# Patient Record
Sex: Female | Born: 1945 | Race: White | Hispanic: No | Marital: Married | Smoking: Never smoker
Health system: Southern US, Community
[De-identification: ages and names within clinical notes are randomized; demographics above are authoritative.]

## PROBLEM LIST (undated history)

## (undated) DIAGNOSIS — F329 Major depressive disorder, single episode, unspecified: Secondary | ICD-10-CM

## (undated) DIAGNOSIS — F32A Depression, unspecified: Secondary | ICD-10-CM

## (undated) DIAGNOSIS — I499 Cardiac arrhythmia, unspecified: Secondary | ICD-10-CM

## (undated) DIAGNOSIS — E049 Nontoxic goiter, unspecified: Secondary | ICD-10-CM

## (undated) DIAGNOSIS — I10 Essential (primary) hypertension: Secondary | ICD-10-CM

## (undated) DIAGNOSIS — I059 Rheumatic mitral valve disease, unspecified: Secondary | ICD-10-CM

## (undated) DIAGNOSIS — M810 Age-related osteoporosis without current pathological fracture: Secondary | ICD-10-CM

## (undated) DIAGNOSIS — F419 Anxiety disorder, unspecified: Secondary | ICD-10-CM

## (undated) DIAGNOSIS — R002 Palpitations: Secondary | ICD-10-CM

## (undated) HISTORY — PX: COLONOSCOPY: SHX174

## (undated) HISTORY — PX: TUBAL LIGATION: SHX77

## (undated) HISTORY — PX: OTHER SURGICAL HISTORY: SHX169

---

## 1998-12-09 ENCOUNTER — Other Ambulatory Visit: Admission: RE | Admit: 1998-12-09 | Discharge: 1998-12-09 | Payer: Self-pay | Admitting: Obstetrics and Gynecology

## 1999-04-07 ENCOUNTER — Ambulatory Visit (HOSPITAL_COMMUNITY): Admission: RE | Admit: 1999-04-07 | Discharge: 1999-04-07 | Payer: Self-pay | Admitting: Obstetrics and Gynecology

## 1999-12-13 ENCOUNTER — Other Ambulatory Visit: Admission: RE | Admit: 1999-12-13 | Discharge: 1999-12-13 | Payer: Self-pay | Admitting: Obstetrics and Gynecology

## 2005-07-27 ENCOUNTER — Ambulatory Visit: Payer: Self-pay | Admitting: Unknown Physician Specialty

## 2006-07-31 ENCOUNTER — Ambulatory Visit: Payer: Self-pay | Admitting: Unknown Physician Specialty

## 2006-08-02 ENCOUNTER — Ambulatory Visit: Payer: Self-pay | Admitting: Unknown Physician Specialty

## 2007-08-06 ENCOUNTER — Ambulatory Visit: Payer: Self-pay | Admitting: Unknown Physician Specialty

## 2007-10-21 ENCOUNTER — Ambulatory Visit: Payer: Self-pay | Admitting: Internal Medicine

## 2008-08-10 ENCOUNTER — Ambulatory Visit: Payer: Self-pay | Admitting: Unknown Physician Specialty

## 2009-08-24 ENCOUNTER — Ambulatory Visit: Payer: Self-pay | Admitting: Unknown Physician Specialty

## 2010-08-25 ENCOUNTER — Ambulatory Visit: Payer: Self-pay | Admitting: Obstetrics and Gynecology

## 2011-09-12 ENCOUNTER — Ambulatory Visit: Payer: Self-pay | Admitting: Unknown Physician Specialty

## 2012-02-20 ENCOUNTER — Ambulatory Visit: Payer: Self-pay | Admitting: Unknown Physician Specialty

## 2012-02-21 LAB — PATHOLOGY REPORT

## 2012-06-05 ENCOUNTER — Ambulatory Visit: Payer: Self-pay | Admitting: Obstetrics and Gynecology

## 2013-06-04 ENCOUNTER — Ambulatory Visit: Payer: Self-pay | Admitting: Obstetrics and Gynecology

## 2013-10-21 ENCOUNTER — Ambulatory Visit: Payer: Self-pay | Admitting: Internal Medicine

## 2013-11-07 ENCOUNTER — Inpatient Hospital Stay: Payer: Self-pay | Admitting: Internal Medicine

## 2013-11-07 LAB — URINALYSIS, COMPLETE
Bacteria: NONE SEEN
Blood: NEGATIVE
Glucose,UR: NEGATIVE mg/dL (ref 0–75)
Ketone: NEGATIVE
Nitrite: NEGATIVE
Protein: NEGATIVE
RBC,UR: <1 /HPF (ref 0–5)
Specific Gravity: 1.004 (ref 1.003–1.030)
Squamous Epithelial: NONE SEEN
WBC UR: NONE SEEN /HPF (ref 0–5)

## 2013-11-07 LAB — TROPONIN I
Troponin-I: <0.02 ng/mL
Troponin-I: <0.02 ng/mL

## 2013-11-07 LAB — CBC WITH DIFFERENTIAL/PLATELET
Eosinophil #: 0 10*3/uL (ref 0.0–0.7)
Eosinophil %: 0.1 %
HGB: 14.3 g/dL (ref 12.0–16.0)
Lymphocyte #: 0.9 10*3/uL — ABNORMAL LOW (ref 1.0–3.6)
Lymphocyte %: 7.6 %
MCH: 31.5 pg (ref 26.0–34.0)
MCHC: 34 g/dL (ref 32.0–36.0)
Monocyte %: 4.1 %
Neutrophil %: 88 %
RDW: 13.3 % (ref 11.5–14.5)
WBC: 11.6 10*3/uL — ABNORMAL HIGH (ref 3.6–11.0)

## 2013-11-07 LAB — BASIC METABOLIC PANEL
Anion Gap: 4 — ABNORMAL LOW (ref 7–16)
BUN: 5 mg/dL — ABNORMAL LOW (ref 7–18)
Calcium, Total: 10.2 mg/dL — ABNORMAL HIGH (ref 8.5–10.1)
Chloride: 102 mmol/L (ref 98–107)
Creatinine: 0.56 mg/dL — ABNORMAL LOW (ref 0.60–1.30)
EGFR (African American): >60
Glucose: 98 mg/dL (ref 65–99)
Osmolality: 271 (ref 275–301)
Sodium: 137 mmol/L (ref 136–145)

## 2013-11-07 LAB — TSH: Thyroid Stimulating Horm: 1.45 u[IU]/mL

## 2013-11-07 LAB — MAGNESIUM: Magnesium: 1.9 mg/dL

## 2013-11-07 LAB — SEDIMENTATION RATE: Erythrocyte Sed Rate: 6 mm/hr (ref 0–30)

## 2013-11-08 LAB — CBC WITH DIFFERENTIAL/PLATELET
Basophil #: 0.1 10*3/uL (ref 0.0–0.1)
HCT: 38.9 % (ref 35.0–47.0)
Lymphocyte #: 1.6 10*3/uL (ref 1.0–3.6)
MCV: 94 fL (ref 80–100)
Monocyte #: 0.6 x10 3/mm (ref 0.2–0.9)
Monocyte %: 8.1 %
Platelet: 217 10*3/uL (ref 150–440)
RBC: 4.12 10*6/uL (ref 3.80–5.20)
RDW: 13.3 % (ref 11.5–14.5)

## 2013-11-08 LAB — TROPONIN I: Troponin-I: <0.02 ng/mL

## 2013-11-08 LAB — BASIC METABOLIC PANEL
Anion Gap: 3 — ABNORMAL LOW (ref 7–16)
BUN: 7 mg/dL (ref 7–18)
Calcium, Total: 8.6 mg/dL (ref 8.5–10.1)
Chloride: 107 mmol/L (ref 98–107)
Creatinine: 0.56 mg/dL — ABNORMAL LOW (ref 0.60–1.30)
EGFR (African American): >60
Osmolality: 275 (ref 275–301)
Potassium: 3.6 mmol/L (ref 3.5–5.1)
Sodium: 139 mmol/L (ref 136–145)

## 2014-08-04 ENCOUNTER — Ambulatory Visit: Payer: Self-pay | Admitting: Obstetrics and Gynecology

## 2014-08-06 ENCOUNTER — Ambulatory Visit: Payer: Self-pay | Admitting: Obstetrics and Gynecology

## 2015-03-19 NOTE — Consult Note (Signed)
PATIENT NAME:  Charlotte Webb, Charlotte Webb MR#:  [id number] DATE OF BIRTH:  August 26, 1946  DATE OF CONSULTATION:  11/07/2013  REFERRING PHYSICIAN:  Emily Filbert, MD CONSULTING PHYSICIAN:  Isaias Cowman, MD  CHIEF COMPLAINT: Dizziness.   HISTORY OF PRESENT ILLNESS: The patient is a 69 year old female referred for evaluation of near syncope and frequent ventricular ectopy. The patient reports that she was in her usual state of health until 10/06/2013 at which time she experienced a near syncopal episode and has not felt well since. The patient underwent recent stress echocardiogram 10/29/2013 which revealed normal left ventricular function without evidence for exercise-induced ischemia.   The patient apparently had a recent Holter monitor which showed frequent multifocal PVCs and apparent short ventricular run.   She has also had a recent brain MRI which was negative.   Last evening the patient felt presyncopal and was noted to have tachycardia in rates in the 140s with elevated blood pressure. EMS was called; however, upon arrival, the patient's symptoms resolved. The patient was admitted to telemetry where she has been in normal sinus rhythm at a rate of 90 bpm. Troponin is negative. The patient apparently had a carotid ultrasound this afternoon.   PAST MEDICAL HISTORY:  1.  Frequent ventricular ectopy 2.  Presyncope.  3.  Hyperlipidemia.  4.  Varicose veins.  5.  Mitral regurgitation.  6.  Osteoporosis.   MEDICATIONS: Hydrochlorothiazide 12.5 mg daily, etodolac 400 mg p.r.n., Wellbutrin XL 150 mg daily.   SOCIAL HISTORY: The patient is married and resides with her husband. Denies tobacco or EtOH abuse.   FAMILY HISTORY: No immediate family history of coronary artery disease or myocardial infarction.   REVIEW OF SYSTEMS: CONSTITUTIONAL: No fever or chills.  EYES: No blurry vision.  EARS: No hearing loss.  RESPIRATORY: No shortness of breath.  CARDIOVASCULAR: No chest pain. The patient has  had dizziness and presyncope as described above.  GASTROINTESTINAL: No nausea, vomiting, diarrhea, or constipation.  GENITOURINARY: No dysuria or hematuria.  ENDOCRINE: No polyuria or polydipsia.  MUSCULOSKELETAL: No arthralgias or myalgias.  NEUROLOGICAL: No focal muscle weakness or numbness.  PSYCHOLOGICAL: No depression or anxiety.   PHYSICAL EXAMINATION:  VITAL SIGNS: Blood pressure 150/81, pulse 86, respirations 20, temperature 98.5, pulse ox 97%.  HEENT: Pupils equal and reactive to light and accommodation.  NECK: Supple without thyromegaly.  LUNGS: Clear.  HEART: Normal JVP. Normal PMI. Regular rate and rhythm. Normal S1, S2. No appreciable gallop, murmur, or rub.  ABDOMEN: Soft and nontender. Pulses were intact bilaterally.  MUSCULOSKELETAL: Normal muscle tone.  NEUROLOGIC: Alert and oriented x3. Motor and sensory both grossly intact.   IMPRESSION: A 69 year old female with a recent history of dizziness and presyncope found to have frequent ventricular ectopy with apparently recent normal stress echocardiogram with known normal left ventricular function.   At this time, telemetry reveals sinus rhythm with normal heart rate and blood pressure. The patient just had a carotid ultrasound performed.   RECOMMENDATIONS:  1.  Agree with overall current therapy.  2.  Would defer full-dose anticoagulation.  3.  Await carotid ultrasound results.  4.  Will discuss with Dr. Sabra Heck with regard to recent Holter monitor results. Hopefully we will be able to view the Holter monitor.  5.  Pending the patient's initial clinical course, will consider further cardiac workup which may also include cardiac catheterization to definitively rule out underlying coronary artery disease.   ____________________________ Isaias Cowman, MD ap:np D: 11/07/2013 16:32:59 ET T: 11/07/2013 17:03:29 ET JOB#: [id number]  cc: Isaias Cowman, MD, <Dictator> Isaias Cowman MD ELECTRONICALLY SIGNED  11/14/2013 8:52

## 2015-03-19 NOTE — Discharge Summary (Signed)
PATIENT NAME:  Charlotte Webb, Charlotte Webb MR#:  [id number] DATE OF BIRTH:  03-23-1946  DATE OF ADMISSION:  11/07/2013 DATE OF DISCHARGE:  11/09/2013  DISCHARGE DIAGNOSES:   1.  Nonsustained ventricular tachycardia.  2.  Thyroid nodule.   DISCHARGE MEDICATIONS:  Aspirin 81 mg daily, Wellbutrin XL 150 mg daily, Toprol-XL 25 mg 1/2 tab daily.   REASON FOR ADMISSION: A 69 year old female presents with tachyrhythmia and chest pain. Please see H and P for history of present illness, past medical history and physical exam.   HOSPITAL COURSE: The patient was admitted. Cardiac enzymes were normal. Telemetry showed normal sinus rhythm. She had recently had a stress echo showing no ischemia with normal stress cardiac enzymes and normal telemetry. No further cardiac workup was done. Toprol-XL, low-dose was added. If she were to have further problem, she would need electrophysiology testing. No signs for coronary ischemia. She had had a heart catheterization about 7 years ago which was normal and with her current symptomatology the risk of heart catheterization weighed the benefit. Overall prognosis is good.   FOLLOWUP: She will follow up with Dr. Sabra Heck in 1 week.   ____________________________ Rusty Aus, MD mfm:cs D: 11/09/2013 09:02:53 ET T: 11/09/2013 15:56:15 ET JOB#: [id number]  cc: Rusty Aus, MD, <Dictator> MARK Roselee Culver MD ELECTRONICALLY SIGNED 11/10/2013 8:08

## 2015-03-19 NOTE — H&P (Signed)
PATIENT NAME:  MCKENSI, REDINGER MR#:  [id number] DATE OF BIRTH:  [birth year]  DATE OF ADMISSION:  11/07/2013  HISTORY OF PRESENT ILLNESS:  Charlotte Webb is a 69 year old female who presents with syncope and an episode of ventricular tachycardia with chest pain. She has been feeling poorly for about a month. She has had episodes of tachyarrhythmias. Holter monitor showed short bursts of multifocal PVCs and a short run of VT. She underwent stress echo testing, which was normal. Brain MRI normal. She has been very dizzy with this. She has had blood work, which essentially was normal. However, last night, about 7 p.m., just sitting there sewing, she began having chest pain, rapid tachycardia, heart rate in the 140s, blood pressure up to 150/100, lasted an hour. EMS came out. By that time the tachycardia had resolved. Telemetry strip was normal then. She has been wiped out, tired, continues with chest pain off and on since, some epigastric pain, otherwise nonfocal. No focal neurologic deficits. With this episode and associated syncope with arrhythmia, she will be admitted for further evaluation and treatment.  PAST MEDICAL HISTORY AND MEDICAL ILLNESSES:  Osteoporosis.   PAST SURGICAL HISTORY:  1.  Thyroid nodule 2.  BTL.   ALLERGIES:  None.   MEDICATIONS:  1.  Hydrochlorothiazide 12.5 mg daily.  2.  Etodolac 400 mg b.i.d. p.r.n.  3.  Wellbutrin XL 150 mg daily.   SOCIAL HISTORY:  No smoking or alcohol.   FAMILY HISTORY:  Noncontributory.   REVIEW OF SYSTEMS:  Otherwise negative.   PHYSICAL EXAMINATION: VITAL SIGNS:  Blood pressure 144/82, pulse 108, regular. Weight 148.   NECK:  No JVD.  LUNGS:  Clear to auscultation and percussion.  HEART:  Regular rhythm, 2/6 systolic murmur, apex.  ABDOMEN:  Minimal epigastric tenderness, soft, nontender. No masses.  EXTREMITIES:  No edema.  SKIN:  No rash.  NEUROLOGIC:  Nonfocal. Bilateral cranial nerves intact.   LABORATORY DATA:  EKG shows  normal sinus rhythm.   ASSESSMENT AND PLAN:  Syncope/ventricular tachycardia - has episodic at times, very wiped out after that, still some chest pain. We will check troponins, telemetry. She can essentially barely stand up this morning post episode. We will get cardiology involved with great consideration for heart catheterization to look for ischemic disease. This could be a primary arrhythmic problem but certainly want to rule out ischemia primarily. At this point, we will give IV fluids, check electrolytes, get cardiology involved and strongly consider a heart catheterization. Ultimately, may need significant antiarrhythmic medications. There is no clear evidence for other secondary issues going on causing this problem.   ____________________________ Rusty Aus, MD mfm:jm D: 11/07/2013 11:34:00 ET T: 11/07/2013 11:53:05 ET JOB#: [id number]  cc: Rusty Aus, MD, <Dictator> Lyllie Cobbins Roselee Culver MD ELECTRONICALLY SIGNED 11/08/2013 9:11

## 2015-07-12 ENCOUNTER — Other Ambulatory Visit: Payer: Self-pay | Admitting: Internal Medicine

## 2015-07-12 DIAGNOSIS — Z1231 Encounter for screening mammogram for malignant neoplasm of breast: Secondary | ICD-10-CM

## 2015-08-09 ENCOUNTER — Ambulatory Visit
Admission: RE | Admit: 2015-08-09 | Discharge: 2015-08-09 | Disposition: A | Discharge status: Home / Self Care | Payer: Medicare HMO | Source: Ambulatory Visit | Attending: Internal Medicine | Admitting: Internal Medicine

## 2015-08-09 ENCOUNTER — Other Ambulatory Visit: Payer: Self-pay | Admitting: Internal Medicine

## 2015-08-09 DIAGNOSIS — Z1231 Encounter for screening mammogram for malignant neoplasm of breast: Secondary | ICD-10-CM | POA: Diagnosis not present

## 2016-08-28 ENCOUNTER — Other Ambulatory Visit: Payer: Self-pay | Admitting: Internal Medicine

## 2016-08-28 DIAGNOSIS — Z1231 Encounter for screening mammogram for malignant neoplasm of breast: Secondary | ICD-10-CM

## 2016-09-22 ENCOUNTER — Ambulatory Visit
Admission: RE | Admit: 2016-09-22 | Discharge: 2016-09-22 | Disposition: A | Discharge status: Home / Self Care | Payer: Medicare HMO | Source: Ambulatory Visit | Attending: Internal Medicine | Admitting: Internal Medicine

## 2016-09-22 DIAGNOSIS — Z1231 Encounter for screening mammogram for malignant neoplasm of breast: Secondary | ICD-10-CM | POA: Insufficient documentation

## 2017-08-16 ENCOUNTER — Other Ambulatory Visit: Payer: Self-pay | Admitting: Internal Medicine

## 2017-08-16 DIAGNOSIS — Z1231 Encounter for screening mammogram for malignant neoplasm of breast: Secondary | ICD-10-CM

## 2017-09-25 ENCOUNTER — Ambulatory Visit
Admission: RE | Admit: 2017-09-25 | Discharge: 2017-09-25 | Disposition: A | Discharge status: Home / Self Care | Payer: Medicare HMO | Source: Ambulatory Visit | Attending: Internal Medicine | Admitting: Internal Medicine

## 2017-09-25 DIAGNOSIS — Z1231 Encounter for screening mammogram for malignant neoplasm of breast: Secondary | ICD-10-CM | POA: Diagnosis not present

## 2017-12-04 ENCOUNTER — Other Ambulatory Visit: Payer: Self-pay | Admitting: Nurse Practitioner

## 2017-12-04 DIAGNOSIS — R1032 Left lower quadrant pain: Secondary | ICD-10-CM

## 2017-12-12 ENCOUNTER — Ambulatory Visit
Admission: RE | Admit: 2017-12-12 | Discharge: 2017-12-12 | Disposition: A | Discharge status: Home / Self Care | Payer: Medicare HMO | Source: Ambulatory Visit | Attending: Nurse Practitioner | Admitting: Nurse Practitioner

## 2017-12-12 DIAGNOSIS — R1032 Left lower quadrant pain: Secondary | ICD-10-CM | POA: Diagnosis not present

## 2017-12-12 DIAGNOSIS — I700 Atherosclerosis of aorta: Secondary | ICD-10-CM | POA: Insufficient documentation

## 2017-12-12 DIAGNOSIS — K7689 Other specified diseases of liver: Secondary | ICD-10-CM | POA: Diagnosis not present

## 2017-12-12 DIAGNOSIS — K5730 Diverticulosis of large intestine without perforation or abscess without bleeding: Secondary | ICD-10-CM | POA: Insufficient documentation

## 2017-12-12 MED ORDER — IOPAMIDOL (ISOVUE-300) INJECTION 61%
100.0000 mL | Freq: Once | INTRAVENOUS | Status: AC | PRN
  Administered 2017-12-12: 100 mL via INTRAVENOUS

## 2017-12-13 ENCOUNTER — Other Ambulatory Visit: Payer: Self-pay | Admitting: Nurse Practitioner

## 2017-12-13 DIAGNOSIS — K769 Liver disease, unspecified: Secondary | ICD-10-CM

## 2017-12-28 ENCOUNTER — Ambulatory Visit: Payer: Medicare HMO

## 2018-01-08 ENCOUNTER — Ambulatory Visit
Admission: RE | Admit: 2018-01-08 | Discharge: 2018-01-08 | Disposition: A | Discharge status: Home / Self Care | Payer: Medicare HMO | Source: Ambulatory Visit | Attending: Nurse Practitioner | Admitting: Nurse Practitioner

## 2018-01-08 DIAGNOSIS — D1803 Hemangioma of intra-abdominal structures: Secondary | ICD-10-CM | POA: Insufficient documentation

## 2018-01-08 DIAGNOSIS — K769 Liver disease, unspecified: Secondary | ICD-10-CM

## 2018-01-08 DIAGNOSIS — K7689 Other specified diseases of liver: Secondary | ICD-10-CM | POA: Diagnosis not present

## 2018-01-08 MED ORDER — GADOBENATE DIMEGLUMINE 529 MG/ML IV SOLN
15.0000 mL | Freq: Once | INTRAVENOUS | Status: AC | PRN
  Administered 2018-01-08: 15 mL via INTRAVENOUS

## 2018-01-29 ENCOUNTER — Encounter: Payer: Self-pay | Admitting: *Deleted

## 2018-01-30 ENCOUNTER — Ambulatory Visit: Payer: Medicare HMO | Admitting: Anesthesiology

## 2018-01-30 ENCOUNTER — Ambulatory Visit
Admission: RE | Admit: 2018-01-30 | Discharge: 2018-01-30 | Disposition: A | Discharge status: Home / Self Care | Payer: Medicare HMO | Source: Ambulatory Visit | Attending: Unknown Physician Specialty | Admitting: Unknown Physician Specialty

## 2018-01-30 ENCOUNTER — Encounter: Payer: Self-pay | Admitting: *Deleted

## 2018-01-30 ENCOUNTER — Encounter
Admission: RE | Disposition: A | Discharge status: Home / Self Care | Payer: Self-pay | Source: Ambulatory Visit | Attending: Unknown Physician Specialty

## 2018-01-30 DIAGNOSIS — F329 Major depressive disorder, single episode, unspecified: Secondary | ICD-10-CM | POA: Insufficient documentation

## 2018-01-30 DIAGNOSIS — F419 Anxiety disorder, unspecified: Secondary | ICD-10-CM | POA: Insufficient documentation

## 2018-01-30 DIAGNOSIS — Z8371 Family history of colonic polyps: Secondary | ICD-10-CM | POA: Diagnosis not present

## 2018-01-30 DIAGNOSIS — Z7982 Long term (current) use of aspirin: Secondary | ICD-10-CM | POA: Insufficient documentation

## 2018-01-30 DIAGNOSIS — Z1211 Encounter for screening for malignant neoplasm of colon: Secondary | ICD-10-CM | POA: Insufficient documentation

## 2018-01-30 DIAGNOSIS — K5730 Diverticulosis of large intestine without perforation or abscess without bleeding: Secondary | ICD-10-CM | POA: Diagnosis not present

## 2018-01-30 DIAGNOSIS — Z79899 Other long term (current) drug therapy: Secondary | ICD-10-CM | POA: Diagnosis not present

## 2018-01-30 DIAGNOSIS — K640 First degree hemorrhoids: Secondary | ICD-10-CM | POA: Diagnosis not present

## 2018-01-30 HISTORY — DX: Cardiac arrhythmia, unspecified: I49.9

## 2018-01-30 HISTORY — PX: COLONOSCOPY WITH PROPOFOL: SHX5780

## 2018-01-30 HISTORY — DX: Major depressive disorder, single episode, unspecified: F32.9

## 2018-01-30 HISTORY — DX: Age-related osteoporosis without current pathological fracture: M81.0

## 2018-01-30 HISTORY — DX: Anxiety disorder, unspecified: F41.9

## 2018-01-30 HISTORY — DX: Depression, unspecified: F32.A

## 2018-01-30 HISTORY — DX: Nontoxic goiter, unspecified: E04.9

## 2018-01-30 HISTORY — DX: Rheumatic mitral valve disease, unspecified: I05.9

## 2018-01-30 SURGERY — COLONOSCOPY WITH PROPOFOL
Anesthesia: General

## 2018-01-30 MED ORDER — PROPOFOL 10 MG/ML IV BOLUS
INTRAVENOUS | Status: DC | PRN
  Administered 2018-01-30: 100 mg via INTRAVENOUS

## 2018-01-30 MED ORDER — LIDOCAINE 2% (20 MG/ML) 5 ML SYRINGE
INTRAMUSCULAR | Status: DC | PRN
  Administered 2018-01-30: 30 mg via INTRAVENOUS

## 2018-01-30 MED ORDER — FENTANYL CITRATE (PF) 100 MCG/2ML IJ SOLN
INTRAMUSCULAR | Status: DC | PRN
  Administered 2018-01-30: 50 ug via INTRAVENOUS

## 2018-01-30 MED ORDER — PROPOFOL 10 MG/ML IV BOLUS
INTRAVENOUS | Status: AC
  Filled 2018-01-30: qty 20

## 2018-01-30 MED ORDER — PHENYLEPHRINE HCL 10 MG/ML IJ SOLN
INTRAMUSCULAR | Status: DC | PRN
  Administered 2018-01-30: 100 ug via INTRAVENOUS

## 2018-01-30 MED ORDER — LIDOCAINE HCL (PF) 2 % IJ SOLN
INTRAMUSCULAR | Status: AC
  Filled 2018-01-30: qty 10

## 2018-01-30 MED ORDER — SODIUM CHLORIDE 0.9 % IV SOLN
INTRAVENOUS | Status: DC
  Administered 2018-01-30 (×2): via INTRAVENOUS

## 2018-01-30 MED ORDER — SODIUM CHLORIDE 0.9 % IV SOLN: INTRAVENOUS | Status: DC

## 2018-01-30 MED ORDER — PROPOFOL 500 MG/50ML IV EMUL
INTRAVENOUS | Status: DC | PRN
  Administered 2018-01-30: 140 ug/kg/min via INTRAVENOUS

## 2018-01-30 MED ORDER — PROPOFOL 500 MG/50ML IV EMUL
INTRAVENOUS | Status: AC
  Filled 2018-01-30: qty 50

## 2018-01-30 MED ORDER — FENTANYL CITRATE (PF) 100 MCG/2ML IJ SOLN
INTRAMUSCULAR | Status: AC
  Filled 2018-01-30: qty 2

## 2018-01-30 NOTE — Anesthesia Preprocedure Evaluation (Signed)
Anesthesia Evaluation  Patient identified by MRN, date of birth, ID band Patient awake    Reviewed: Allergy & Precautions, H&P , NPO status , Patient's Chart, lab work & pertinent test results  History of Anesthesia Complications Negative for: history of anesthetic complications  Airway Mallampati: III  TM Distance: >3 FB Neck ROM: limited    Dental  (+) Chipped   Pulmonary neg pulmonary ROS, neg shortness of breath,           Cardiovascular Exercise Tolerance: Good (-) angina(-) Past MI and (-) DOE negative cardio ROS       Neuro/Psych PSYCHIATRIC DISORDERS Anxiety Depression negative neurological ROS     GI/Hepatic negative GI ROS, Neg liver ROS, neg GERD  ,  Endo/Other  negative endocrine ROS  Renal/GU negative Renal ROS  negative genitourinary   Musculoskeletal   Abdominal   Peds  Hematology negative hematology ROS (+)   Anesthesia Other Findings Past Medical History: No date: Anxiety No date: Arrhythmia No date: Depression No date: Goiter No date: Mitral valve disorder No date: Osteoporosis  Past Surgical History: No date: COLONOSCOPY No date: thyroid nodule removed 20 years ago No date: TUBAL LIGATION     Reproductive/Obstetrics negative OB ROS                             Anesthesia Physical Anesthesia Plan  ASA: III  Anesthesia Plan: General   Post-op Pain Management:    Induction: Intravenous  PONV Risk Score and Plan: Propofol infusion  Airway Management Planned: Natural Airway and Nasal Cannula  Additional Equipment:   Intra-op Plan:   Post-operative Plan:   Informed Consent: I have reviewed the patients History and Physical, chart, labs and discussed the procedure including the risks, benefits and alternatives for the proposed anesthesia with the patient or authorized representative who has indicated his/her understanding and acceptance.   Dental  Advisory Given  Plan Discussed with: Anesthesiologist, CRNA and Surgeon  Anesthesia Plan Comments: (Patient consented for risks of anesthesia including but not limited to:  - adverse reactions to medications - risk of intubation if required - damage to teeth, lips or other oral mucosa - sore throat or hoarseness - Damage to heart, brain, lungs or loss of life  Patient voiced understanding.)        Anesthesia Quick Evaluation

## 2018-01-30 NOTE — Anesthesia Post-op Follow-up Note (Signed)
Anesthesia QCDR form completed.        

## 2018-01-30 NOTE — Transfer of Care (Signed)
Immediate Anesthesia Transfer of Care Note  Patient: Charlotte Webb  Procedure(s) Performed: COLONOSCOPY WITH PROPOFOL (N/A )  Patient Location: PACU and Endoscopy Unit  Anesthesia Type:General  Level of Consciousness: sedated  Airway & Oxygen Therapy: Patient Spontanous Breathing and Patient connected to nasal cannula oxygen  Post-op Assessment: Report given to RN and Post -op Vital signs reviewed and stable  Post vital signs: Reviewed and stable  Last Vitals:  Vitals:   01/30/18 0656  BP: (!) 150/73  Pulse: 71  Resp: 16  Temp: (!) 36.3 C  SpO2: 99%    Last Pain:  Vitals:   01/30/18 0656  TempSrc: Tympanic         Complications: No apparent anesthesia complications

## 2018-01-30 NOTE — H&P (Signed)
   Primary Care Physician:  Rusty Aus, MD Primary Gastroenterologist:  Dr. Vira Agar  Pre-Procedure History & Physical: HPI:  Charlotte Webb is a 72 y.o. female is here for an screening colonoscopy.  A sister has had colon polyps   Past Medical History:  Diagnosis Date  . Anxiety   . Arrhythmia   . Depression   . Goiter   . Mitral valve disorder   . Osteoporosis     Past Surgical History:  Procedure Laterality Date  . COLONOSCOPY    . thyroid nodule removed 20 years ago    . TUBAL LIGATION      Prior to Admission medications   Medication Sig Start Date End Date Taking? Authorizing Provider  acetaminophen (TYLENOL) 500 MG tablet Take 500 mg by mouth every 4 (four) hours as needed.   Yes [provider]  aspirin EC 81 MG tablet Take 81 mg by mouth daily.   Yes [provider]  Calcium Carb-Cholecalciferol (CALCIUM CARBONATE-VITAMIN D3 PO) Take 500 mg by mouth.   Yes [provider]  escitalopram (LEXAPRO) 10 MG tablet Take 10 mg by mouth daily.   Yes [provider]  metoprolol succinate (TOPROL-XL) 25 MG 24 hr tablet Take 12.5 mg by mouth 2 (two) times daily.   Yes [provider]    Allergies as of 12/10/2017  . (Not on File)    Family History  Problem Relation Age of Onset  . Brain cancer Mother   . Lung cancer Maternal Uncle   . Ovarian cancer Maternal Grandmother   . Breast cancer Neg Hx     Social History   Socioeconomic History  . Marital status: Married    Spouse name: Not on file  . Number of children: Not on file  . Years of education: Not on file  . Highest education level: Not on file  Social Needs  . Financial resource strain: Not on file  . Food insecurity - worry: Not on file  . Food insecurity - inability: Not on file  . Transportation needs - medical: Not on file  . Transportation needs - non-medical: Not on file  Occupational History  . Not on file  Tobacco Use  . Smoking status: Never  Smoker  . Smokeless tobacco: Never Used  Substance and Sexual Activity  . Alcohol use: No    Frequency: Never  . Drug use: No  . Sexual activity: Not on file  Other Topics Concern  . Not on file  Social History Narrative  . Not on file    Review of Systems: See HPI, otherwise negative ROS  Physical Exam: BP (!) 150/73   Pulse 71   Temp (!) 97.4 F (36.3 C) (Tympanic)   Resp 16   SpO2 99%  General:   Alert,  pleasant and cooperative in NAD Head:  Normocephalic and atraumatic. Neck:  Supple; no masses or thyromegaly. Lungs:  Clear throughout to auscultation.    Heart:  Regular rate and rhythm. Abdomen:  Soft, nontender and nondistended. Normal bowel sounds, without guarding, and without rebound.   Neurologic:  Alert and  oriented x4;  grossly normal neurologically.  Impression/Plan: Charlotte Webb is here for an colonoscopy to be performed for screening for family history of colon polyps.  Risks, benefits, limitations, and alternatives regarding  colonoscopy have been reviewed with the patient.  Questions have been answered.  All parties agreeable.   Gaylyn Cheers, MD  01/30/2018, 7:29 AM

## 2018-01-30 NOTE — Op Note (Signed)
[facility] Gastroenterology Patient Name: Charlotte Webb Procedure Date: 01/30/2018 7:25 AM MRN: [id number] Account #: [id number] Date of Birth: July 26, 1946 Admit Type: Outpatient Age: 72 Room: [facility] ENDO ROOM 3 Gender: Female Note Status: Finalized Procedure:            Colonoscopy Indications:          Colon cancer screening in patient at increased risk:                        Family history of 1st-degree relative with colon polyps Providers:            Manya Silvas, MD Referring MD:         Rusty Aus, MD (Referring MD) Medicines:            Propofol per Anesthesia Complications:        No immediate complications. Procedure:            Pre-Anesthesia Assessment:                       - After reviewing the risks and benefits, the patient                        was deemed in satisfactory condition to undergo the                        procedure.                       After obtaining informed consent, the colonoscope was                        passed under direct vision. Throughout the procedure,                        the patient's blood pressure, pulse, and oxygen                        saturations were monitored continuously. The                        Colonoscope was introduced through the anus and                        advanced to the the cecum, identified by appendiceal                        orifice and ileocecal valve. The colonoscopy was                        performed without difficulty. The patient tolerated the                        procedure well. The quality of the bowel preparation                        was excellent. Findings:      Multiple small-mouthed diverticula were found in the sigmoid colon.      Internal hemorrhoids were found during endoscopy. The hemorrhoids were       small and Grade I (internal hemorrhoids that do not prolapse).  The exam was otherwise without abnormality. Impression:           - Diverticulosis in the  sigmoid colon.                       - Internal hemorrhoids.                       - The examination was otherwise normal.                       - No specimens collected. Recommendation:       - Repeat colonoscopy in 5 years for screening purposes. Manya Silvas, MD 01/30/2018 7:53:38 AM This report has been signed electronically. Number of Addenda: 0 Note Initiated On: 01/30/2018 7:25 AM Scope Withdrawal Time: 0 hours 8 minutes 17 seconds  Total Procedure Duration: 0 hours 14 minutes 17 seconds       [facility]

## 2018-01-30 NOTE — Anesthesia Postprocedure Evaluation (Signed)
Anesthesia Post Note  Patient: Charlotte Webb  Procedure(s) Performed: COLONOSCOPY WITH PROPOFOL (N/A )  Patient location during evaluation: Endoscopy Anesthesia Type: General Level of consciousness: awake and alert Pain management: pain level controlled Vital Signs Assessment: post-procedure vital signs reviewed and stable Respiratory status: spontaneous breathing, nonlabored ventilation, respiratory function stable and patient connected to nasal cannula oxygen Cardiovascular status: blood pressure returned to baseline and stable Postop Assessment: no apparent nausea or vomiting Anesthetic complications: no     Last Vitals:  Vitals:   01/30/18 0757 01/30/18 0759  BP: (!) 93/47 (!) 95/50  Pulse: 66   Resp: 13   Temp: (!) 36.3 C   SpO2: 100%     Last Pain:  Vitals:   01/30/18 0755  TempSrc: Tympanic                 Precious Haws Vidal Lampkins

## 2018-01-31 ENCOUNTER — Encounter: Payer: Self-pay | Admitting: Unknown Physician Specialty

## 2018-08-15 ENCOUNTER — Other Ambulatory Visit: Payer: Self-pay | Admitting: Internal Medicine

## 2018-08-15 DIAGNOSIS — Z1231 Encounter for screening mammogram for malignant neoplasm of breast: Secondary | ICD-10-CM

## 2018-09-26 ENCOUNTER — Ambulatory Visit
Admission: RE | Admit: 2018-09-26 | Discharge: 2018-09-26 | Disposition: A | Discharge status: Home / Self Care | Payer: Medicare HMO | Source: Ambulatory Visit | Attending: Internal Medicine | Admitting: Internal Medicine

## 2018-09-26 DIAGNOSIS — Z1231 Encounter for screening mammogram for malignant neoplasm of breast: Secondary | ICD-10-CM | POA: Insufficient documentation

## 2018-10-03 ENCOUNTER — Emergency Department: Payer: Medicare HMO

## 2018-10-03 ENCOUNTER — Inpatient Hospital Stay
Admission: EM | Admit: 2018-10-03 | Discharge: 2018-10-06 | DRG: 482 | Disposition: A | Discharge status: Home Health | Payer: Medicare HMO | Attending: Orthopedic Surgery | Admitting: Orthopedic Surgery

## 2018-10-03 ENCOUNTER — Encounter: Payer: Self-pay | Admitting: Emergency Medicine

## 2018-10-03 ENCOUNTER — Other Ambulatory Visit: Payer: Self-pay

## 2018-10-03 DIAGNOSIS — M810 Age-related osteoporosis without current pathological fracture: Secondary | ICD-10-CM | POA: Diagnosis present

## 2018-10-03 DIAGNOSIS — S72009A Fracture of unspecified part of neck of unspecified femur, initial encounter for closed fracture: Secondary | ICD-10-CM | POA: Diagnosis present

## 2018-10-03 DIAGNOSIS — W1839XA Other fall on same level, initial encounter: Secondary | ICD-10-CM | POA: Diagnosis present

## 2018-10-03 DIAGNOSIS — S72012A Unspecified intracapsular fracture of left femur, initial encounter for closed fracture: Secondary | ICD-10-CM | POA: Diagnosis present

## 2018-10-03 DIAGNOSIS — F329 Major depressive disorder, single episode, unspecified: Secondary | ICD-10-CM | POA: Diagnosis present

## 2018-10-03 DIAGNOSIS — E876 Hypokalemia: Secondary | ICD-10-CM | POA: Diagnosis present

## 2018-10-03 DIAGNOSIS — Y93K1 Activity, walking an animal: Secondary | ICD-10-CM | POA: Diagnosis not present

## 2018-10-03 DIAGNOSIS — M25552 Pain in left hip: Secondary | ICD-10-CM | POA: Diagnosis present

## 2018-10-03 DIAGNOSIS — Z79899 Other long term (current) drug therapy: Secondary | ICD-10-CM

## 2018-10-03 DIAGNOSIS — I10 Essential (primary) hypertension: Secondary | ICD-10-CM | POA: Diagnosis present

## 2018-10-03 DIAGNOSIS — S72002A Fracture of unspecified part of neck of left femur, initial encounter for closed fracture: Secondary | ICD-10-CM

## 2018-10-03 DIAGNOSIS — I059 Rheumatic mitral valve disease, unspecified: Secondary | ICD-10-CM | POA: Diagnosis present

## 2018-10-03 DIAGNOSIS — F419 Anxiety disorder, unspecified: Secondary | ICD-10-CM | POA: Diagnosis present

## 2018-10-03 DIAGNOSIS — Z7982 Long term (current) use of aspirin: Secondary | ICD-10-CM | POA: Diagnosis not present

## 2018-10-03 DIAGNOSIS — T148XXA Other injury of unspecified body region, initial encounter: Secondary | ICD-10-CM

## 2018-10-03 HISTORY — DX: Essential (primary) hypertension: I10

## 2018-10-03 LAB — MRSA PCR SCREENING: MRSA by PCR: NEGATIVE

## 2018-10-03 LAB — COMPREHENSIVE METABOLIC PANEL
ALK PHOS: 71 U/L (ref 38–126)
ALT: 15 U/L (ref 0–44)
AST: 24 U/L (ref 15–41)
Albumin: 4.3 g/dL (ref 3.5–5.0)
Anion gap: 8 (ref 5–15)
BILIRUBIN TOTAL: 0.7 mg/dL (ref 0.3–1.2)
BUN: 14 mg/dL (ref 8–23)
CALCIUM: 9.6 mg/dL (ref 8.9–10.3)
CHLORIDE: 104 mmol/L (ref 98–111)
CO2: 28 mmol/L (ref 22–32)
CREATININE: 0.58 mg/dL (ref 0.44–1.00)
GFR calc Af Amer: >60 mL/min (ref >60)
Glucose, Bld: 125 mg/dL — ABNORMAL HIGH (ref 70–99)
Potassium: 3.4 mmol/L — ABNORMAL LOW (ref 3.5–5.1)
Sodium: 140 mmol/L (ref 135–145)
Total Protein: 7.5 g/dL (ref 6.5–8.1)

## 2018-10-03 LAB — CBC WITH DIFFERENTIAL/PLATELET
ABS IMMATURE GRANULOCYTES: 0.06 10*3/uL (ref 0.00–0.07)
Basophils Absolute: 0 10*3/uL (ref 0.0–0.1)
Basophils Relative: 0 %
Eosinophils Absolute: 0.1 10*3/uL (ref 0.0–0.5)
Eosinophils Relative: 1 %
HEMATOCRIT: 40.9 % (ref 36.0–46.0)
HEMOGLOBIN: 13.3 g/dL (ref 12.0–15.0)
IMMATURE GRANULOCYTES: 1 %
LYMPHS ABS: 1.3 10*3/uL (ref 0.7–4.0)
Lymphocytes Relative: 11 %
MCH: 30.5 pg (ref 26.0–34.0)
MCHC: 32.5 g/dL (ref 30.0–36.0)
MCV: 93.8 fL (ref 80.0–100.0)
MONO ABS: 0.7 10*3/uL (ref 0.1–1.0)
Monocytes Relative: 6 %
NEUTROS ABS: 9.5 10*3/uL — AB (ref 1.7–7.7)
NEUTROS PCT: 81 %
PLATELETS: 245 10*3/uL (ref 150–400)
RBC: 4.36 MIL/uL (ref 3.87–5.11)
RDW: 12.7 % (ref 11.5–15.5)
WBC: 11.6 10*3/uL — ABNORMAL HIGH (ref 4.0–10.5)
nRBC: 0 % (ref 0.0–0.2)

## 2018-10-03 MED ORDER — ESCITALOPRAM OXALATE 10 MG PO TABS
10.0000 mg | ORAL_TABLET | Freq: Every day | ORAL | Status: DC
  Administered 2018-10-05 – 2018-10-06 (×2): 10 mg via ORAL
  Filled 2018-10-03 (×4): qty 1

## 2018-10-03 MED ORDER — CEFAZOLIN SODIUM-DEXTROSE 1-4 GM/50ML-% IV SOLN
1.0000 g | INTRAVENOUS | Status: DC
  Filled 2018-10-03: qty 50

## 2018-10-03 MED ORDER — CEFAZOLIN (ANCEF) 1 G IV SOLR: 1.0000 g | INTRAVENOUS | Status: DC

## 2018-10-03 MED ORDER — ACETAMINOPHEN 650 MG RE SUPP: 650.0000 mg | Freq: Four times a day (QID) | RECTAL | Status: DC | PRN

## 2018-10-03 MED ORDER — METOPROLOL SUCCINATE ER 25 MG PO TB24
12.5000 mg | ORAL_TABLET | Freq: Two times a day (BID) | ORAL | Status: DC
  Administered 2018-10-03 – 2018-10-06 (×6): 12.5 mg via ORAL
  Filled 2018-10-03 (×7): qty 1

## 2018-10-03 MED ORDER — HYDROCODONE-ACETAMINOPHEN 5-325 MG PO TABS
1.0000 | ORAL_TABLET | ORAL | Status: DC | PRN
  Administered 2018-10-03: 2 via ORAL
  Administered 2018-10-04: 1 via ORAL
  Administered 2018-10-04: 2 via ORAL
  Filled 2018-10-03 (×2): qty 1
  Filled 2018-10-03: qty 2
  Filled 2018-10-03: qty 1
  Filled 2018-10-03: qty 2

## 2018-10-03 MED ORDER — ACETAMINOPHEN 500 MG PO TABS
1000.0000 mg | ORAL_TABLET | Freq: Once | ORAL | Status: AC
  Administered 2018-10-03: 1000 mg via ORAL
  Filled 2018-10-03: qty 2

## 2018-10-03 MED ORDER — SODIUM CHLORIDE 0.9 % IV SOLN
INTRAVENOUS | Status: DC
  Administered 2018-10-03 – 2018-10-04 (×3): via INTRAVENOUS

## 2018-10-03 MED ORDER — ONDANSETRON HCL 4 MG PO TABS: 4.0000 mg | ORAL_TABLET | Freq: Four times a day (QID) | ORAL | Status: DC | PRN

## 2018-10-03 MED ORDER — OXYCODONE HCL 5 MG PO TABS
10.0000 mg | ORAL_TABLET | Freq: Once | ORAL | Status: AC
  Administered 2018-10-03: 10 mg via ORAL
  Filled 2018-10-03: qty 2

## 2018-10-03 MED ORDER — ONDANSETRON HCL 4 MG/2ML IJ SOLN
4.0000 mg | Freq: Four times a day (QID) | INTRAMUSCULAR | Status: DC | PRN
  Administered 2018-10-04: 4 mg via INTRAVENOUS
  Filled 2018-10-03: qty 2

## 2018-10-03 MED ORDER — ACETAMINOPHEN 325 MG PO TABS: 650.0000 mg | ORAL_TABLET | Freq: Four times a day (QID) | ORAL | Status: DC | PRN

## 2018-10-03 MED ORDER — INFLUENZA VAC SPLIT HIGH-DOSE 0.5 ML IM SUSY
0.5000 mL | PREFILLED_SYRINGE | INTRAMUSCULAR | Status: DC
  Filled 2018-10-03: qty 0.5

## 2018-10-03 NOTE — H&P (Signed)
[facility] NAME: Charlotte Webb    MR#:  [id number]  DATE OF BIRTH:  [birth year]  DATE OF ADMISSION:  10/03/2018  PRIMARY CARE PHYSICIAN: Rusty Aus, MD   REQUESTING/REFERRING PHYSICIAN: Lisa Roca MD  CHIEF COMPLAINT:   Chief Complaint  Patient presents with  . Fall    HISTORY OF PRESENT ILLNESS: Charlotte Webb  is a 72 y.o. female with a known history of anxiety, depression, hypertension and osteoporosis who is presenting after a fall.  Patient states that she was walking her dog when the dog started running after a cat.  Subsequently patient started following the dog and fell.  And sustained a hip fracture.  Patient otherwise states that she has been in good health.  Has not had any chest pain shortness of breath no palpitations or syncope.  Currently has some pain in the left hip  PAST MEDICAL HISTORY:   Past Medical History:  Diagnosis Date  . Anxiety   . Arrhythmia   . Depression   . Goiter   . Hypertension   . Mitral valve disorder   . Osteoporosis     PAST SURGICAL HISTORY:  Past Surgical History:  Procedure Laterality Date  . COLONOSCOPY    . COLONOSCOPY WITH PROPOFOL N/A 01/30/2018   Procedure: COLONOSCOPY WITH PROPOFOL;  Surgeon: Manya Silvas, MD;  Location: [facility] ENDOSCOPY;  Service: Endoscopy;  Laterality: N/A;  . thyroid nodule removed 20 years ago    . TUBAL LIGATION      SOCIAL HISTORY:  Social History   Tobacco Use  . Smoking status: Never Smoker  . Smokeless tobacco: Never Used  Substance Use Topics  . Alcohol use: No    Frequency: Never    FAMILY HISTORY:  Family History  Problem Relation Age of Onset  . Brain cancer Mother   . Lung cancer Maternal Uncle   . Ovarian cancer Maternal Grandmother   . Breast cancer Neg Hx     DRUG ALLERGIES: No Known Allergies  REVIEW OF SYSTEMS:   CONSTITUTIONAL: No fever, fatigue or weakness.  EYES: No blurred or double vision.  EARS, NOSE,  AND THROAT: No tinnitus or ear pain.  RESPIRATORY: No cough, shortness of breath, wheezing or hemoptysis.  CARDIOVASCULAR: No chest pain, orthopnea, edema.  GASTROINTESTINAL: No nausea, vomiting, diarrhea or abdominal pain.  GENITOURINARY: No dysuria, hematuria.  ENDOCRINE: No polyuria, nocturia,  HEMATOLOGY: No anemia, easy bruising or bleeding SKIN: No rash or lesion. MUSCULOSKELETAL: Positive left hip pain NEUROLOGIC: No tingling, numbness, weakness.  PSYCHIATRY: No anxiety or depression.   MEDICATIONS AT HOME:  Prior to Admission medications   Medication Sig Start Date End Date Taking? Authorizing Provider  acetaminophen (TYLENOL) 500 MG tablet Take 500 mg by mouth every 4 (four) hours as needed.    [provider]  aspirin EC 81 MG tablet Take 81 mg by mouth daily.    [provider]  Calcium Carb-Cholecalciferol (CALCIUM CARBONATE-VITAMIN D3 PO) Take 500 mg by mouth.    [provider]  escitalopram (LEXAPRO) 10 MG tablet Take 10 mg by mouth daily.    [provider]  metoprolol succinate (TOPROL-XL) 25 MG 24 hr tablet Take 12.5 mg by mouth 2 (two) times daily.    [provider]      PHYSICAL EXAMINATION:   VITAL SIGNS: Height 5\' 7"  (1.702 m), weight 70.3 kg.  GENERAL:  72 y.o.-year-old patient lying in the bed with no acute  distress.  EYES: Pupils equal, round, reactive to light and accommodation. No scleral icterus. Extraocular muscles intact.  HEENT: Head atraumatic, normocephalic. Oropharynx and nasopharynx clear.  NECK:  Supple, no jugular venous distention. No thyroid enlargement, no tenderness.  LUNGS: Normal breath sounds bilaterally, no wheezing, rales,rhonchi or crepitation. No use of accessory muscles of respiration.  CARDIOVASCULAR: S1, S2 normal. No murmurs, rubs, or gallops.  ABDOMEN: Soft, nontender, nondistended. Bowel sounds present. No organomegaly or mass.  EXTREMITIES: No pedal edema, cyanosis, or clubbing.   NEUROLOGIC: Cranial nerves II through XII are intact. Muscle strength 5/5 in all extremities. Sensation intact. Gait not checked.  PSYCHIATRIC: The patient is alert and oriented x 3.  SKIN: No obvious rash, lesion, or ulcer.   LABORATORY PANEL:   CBC No results for input(s): WBC, HGB, HCT, PLT, MCV, MCH, MCHC, RDW, LYMPHSABS, MONOABS, EOSABS, BASOSABS, BANDABS in the last 168 hours.  Invalid input(s): NEUTRABS, BANDSABD ------------------------------------------------------------------------------------------------------------------  Chemistries  No results for input(s): NA, K, CL, CO2, GLUCOSE, BUN, CREATININE, CALCIUM, MG, AST, ALT, ALKPHOS, BILITOT in the last 168 hours.  Invalid input(s): GFRCGP ------------------------------------------------------------------------------------------------------------------ CrCl cannot be calculated (Patient's most recent lab result is older than the maximum 21 days allowed.). ------------------------------------------------------------------------------------------------------------------ No results for input(s): TSH, T4TOTAL, T3FREE, THYROIDAB in the last 72 hours.  Invalid input(s): FREET3   Coagulation profile No results for input(s): INR, PROTIME in the last 168 hours. ------------------------------------------------------------------------------------------------------------------- No results for input(s): DDIMER in the last 72 hours. -------------------------------------------------------------------------------------------------------------------  Cardiac Enzymes No results for input(s): CKMB, TROPONINI, MYOGLOBIN in the last 168 hours.  Invalid input(s): CK ------------------------------------------------------------------------------------------------------------------ Invalid input(s): POCBNP  ---------------------------------------------------------------------------------------------------------------  Urinalysis     Component Value Date/Time   COLORURINE Straw 11/07/2013 1340   APPEARANCEUR Clear 11/07/2013 1340   LABSPEC 1.004 11/07/2013 1340   PHURINE 8.0 11/07/2013 1340   GLUCOSEU Negative 11/07/2013 1340   HGBUR Negative 11/07/2013 1340   BILIRUBINUR Negative 11/07/2013 1340   KETONESUR Negative 11/07/2013 1340   PROTEINUR Negative 11/07/2013 1340   NITRITE Negative 11/07/2013 1340   LEUKOCYTESUR Negative 11/07/2013 1340     RADIOLOGY: Dg Chest 1 View  Result Date: 10/03/2018 CLINICAL DATA:  Left hip fracture EXAM: CHEST  1 VIEW COMPARISON:  None. FINDINGS: The heart size and mediastinal contours are within normal limits. Both lungs are clear. The visualized skeletal structures are unremarkable. IMPRESSION: No active disease. Electronically Signed   By: Nelson Chimes M.D.   On: 10/03/2018 16:11   Dg Hip Unilat W Or Wo Pelvis 2-3 Views Left  Result Date: 10/03/2018 CLINICAL DATA:  Presents vis ems s/p fall States she was outside and her dog pulled at the leash She fell forward Landed on left hip area, history of osteoporosis, hypertension EXAM: DG HIP (WITH OR WITHOUT PELVIS) 2-3V LEFT COMPARISON:  None. FINDINGS: There is an acute subcapital femoral neck fracture of the LEFT hip, associated with impaction. No evidence for dislocation. The remainder of the pelvis is negative. IMPRESSION: Acute subcapital LEFT femoral neck fracture. Electronically Signed   By: Nolon Nations M.D.   On: 10/03/2018 15:13    EKG: Orders placed or performed during the hospital encounter of 10/03/18  . ED EKG  . ED EKG    IMPRESSION AND PLAN: Patient 72 year old status post fall with hip fracture  1.  Hip fracture preop evaluation patient does not have any cardiac or pulmonary symptoms.  She has good functional status.  No further cardiopulmonary work-up needed.  Okay to proceed to surgery  2.  Hypertension  continue metoprolol  3.  Depression anxiety continue Lexapro  4.  Miscellaneous recommend DVT  prophylaxis post procedure     All the records are reviewed and case discussed with ED provider. Management plans discussed with the patient, family and they are in agreement.  CODE STATUS:full   TOTAL TIME TAKING CARE OF THIS PATIENT: 66minutes.    Dustin Flock M.D on 10/03/2018 at 4:15 PM  Between 7am to 6pm - Pager - [phone]  After 6pm go to [email or web address] - password [email or web address]  [facility] Office  [phone]  CC: Primary care physician; Rusty Aus, MD

## 2018-10-03 NOTE — ED Notes (Signed)
Patient voided  200 ml in bedpan

## 2018-10-03 NOTE — Progress Notes (Signed)
   10/03/18 2000  Clinical Encounter Type  Visited With Patient and family together  Visit Type Initial;Spiritual support  Referral From Nurse  Recommendations Follow-up as needed.  Spiritual Encounters  Spiritual Needs Emotional;Prayer (Pre-surgery assurance.)   Chaplain responded to a request for prayer and met the patient and family. The patient is nervous about tomorrow's surgery and her pastor cannot visit to provide prayer and presence. Chaplain was empathetic and provided active listening, reflective questions, calming presence, and prayer.

## 2018-10-03 NOTE — ED Notes (Signed)
Npo  At this time

## 2018-10-03 NOTE — ED Notes (Signed)
Admitting [facility] in with pt  Family at bedside

## 2018-10-03 NOTE — ED Provider Notes (Signed)
[facility] Emergency Department Provider Note   ____________________________________________   First MD Initiated Contact with Patient 10/03/18 1428     (approximate)  I have reviewed the triage vital signs and the nursing notes.   HISTORY  Chief Complaint Fall   HPI Charlotte Webb is a 72 y.o. female resents to the ED via EMS after a fall outside today.  Patient states that she was walking her dog when the dog pulled at the leash causing her to fall forward on her left hip.  Patient denies any head injury or loss of consciousness.  She states the only injury she incurred was of her left hip.  She denies any previous injury to her hip.  She rates her pain as an 8 out of 10.   Past Medical History:  Diagnosis Date  . Anxiety   . Arrhythmia   . Depression   . Goiter   . Hypertension   . Mitral valve disorder   . Osteoporosis     Patient Active Problem List   Diagnosis Date Noted  . Hip fracture ([place]) 10/03/2018    Past Surgical History:  Procedure Laterality Date  . COLONOSCOPY    . COLONOSCOPY WITH PROPOFOL N/A 01/30/2018   Procedure: COLONOSCOPY WITH PROPOFOL;  Surgeon: Manya Silvas, MD;  Location: [facility] ENDOSCOPY;  Service: Endoscopy;  Laterality: N/A;  . thyroid nodule removed 20 years ago    . TUBAL LIGATION      Prior to Admission medications   Medication Sig Start Date End Date Taking? Authorizing Provider  acetaminophen (TYLENOL) 500 MG tablet Take 500 mg by mouth every 4 (four) hours as needed.    [provider]  aspirin EC 81 MG tablet Take 81 mg by mouth daily.    [provider]  Calcium Carb-Cholecalciferol (CALCIUM CARBONATE-VITAMIN D3 PO) Take 500 mg by mouth.    [provider]  escitalopram (LEXAPRO) 10 MG tablet Take 10 mg by mouth daily.    [provider]  metoprolol succinate (TOPROL-XL) 25 MG 24 hr tablet Take 12.5 mg by mouth 2 (two) times daily.    [provider]     Allergies Patient has no known allergies.  Family History  Problem Relation Age of Onset  . Brain cancer Mother   . Lung cancer Maternal Uncle   . Ovarian cancer Maternal Grandmother   . Breast cancer Neg Hx     Social History Social History   Tobacco Use  . Smoking status: Never Smoker  . Smokeless tobacco: Never Used  Substance Use Topics  . Alcohol use: No    Frequency: Never  . Drug use: No    Review of Systems Constitutional: No fever/chills Eyes: No visual changes. ENT: No trauma. Cardiovascular: Denies chest pain. Respiratory: Denies shortness of breath. Gastrointestinal: No abdominal pain.  No nausea, no vomiting.  Musculoskeletal: Positive for left hip pain. Skin: Negative for rash. Neurological: Negative for headaches, focal weakness or numbness. ____________________________________________   PHYSICAL EXAM:  VITAL SIGNS: ED Triage Vitals [10/03/18 1419]  Enc Vitals Group     BP      Pulse      Resp      Temp      Temp src      SpO2      Weight 155 lb (70.3 kg)     Height 5\' 7"  (1.702 m)     Head Circumference      Peak Flow  Pain Score 8     Pain Loc      Pain Edu?      Excl. in [place]?    Constitutional: Alert and oriented. Well appearing and in no acute distress. Eyes: Conjunctivae are normal. Head: Atraumatic. Nose: No trauma. Mouth/Throat: Mucous membranes are moist.  Oropharynx non-erythematous. Neck: No stridor.  No cervical tenderness on palpation posteriorly. Cardiovascular: Normal rate, regular rhythm. Grossly normal heart sounds.  Good peripheral circulation. Respiratory: Normal respiratory effort.  No retractions. Lungs CTAB. Gastrointestinal: Soft and nontender. No distention.  Musculoskeletal: Patient is able to move upper extremities that any difficulty.  No evidence of injury is noted.  Nontender anterior chest to palpation.  Patient is nontender bilateral hips to compression.  No shortening of the extremity or rotation  of the left foot is noted.  Patient has limited range of motion secondary to pain.  Pulses present and motor sensory function intact. Neurologic:  Normal speech and language. No gross focal neurologic deficits are appreciated. Skin:  Skin is warm, dry and intact.  Psychiatric: Mood and affect are normal. Speech and behavior are normal.  ____________________________________________   LABS (all labs ordered are listed, but only abnormal results are displayed)  Labs Reviewed  CBC WITH DIFFERENTIAL/PLATELET - Abnormal; Notable for the following components:      Result Value   WBC 11.6 (*)    Neutro Abs 9.5 (*)    All other components within normal limits  COMPREHENSIVE METABOLIC PANEL - Abnormal; Notable for the following components:   Potassium 3.4 (*)    Glucose, Bld 125 (*)    All other components within normal limits   ____________________________________________  EKG  Read by Dr. Reita Cliche. ____________________________________________  [place]  Official radiology report(s): Dg Chest 1 View  Result Date: 10/03/2018 CLINICAL DATA:  Left hip fracture EXAM: CHEST  1 VIEW COMPARISON:  None. FINDINGS: The heart size and mediastinal contours are within normal limits. Both lungs are clear. The visualized skeletal structures are unremarkable. IMPRESSION: No active disease. Electronically Signed   By: Nelson Chimes M.D.   On: 10/03/2018 16:11   Dg Hip Unilat W Or Wo Pelvis 2-3 Views Left  Result Date: 10/03/2018 CLINICAL DATA:  Presents vis ems s/p fall States she was outside and her dog pulled at the leash She fell forward Landed on left hip area, history of osteoporosis, hypertension EXAM: DG HIP (WITH OR WITHOUT PELVIS) 2-3V LEFT COMPARISON:  None. FINDINGS: There is an acute subcapital femoral neck fracture of the LEFT hip, associated with impaction. No evidence for dislocation. The remainder of the pelvis is negative. IMPRESSION: Acute subcapital LEFT femoral neck fracture.  Electronically Signed   By: Nolon Nations M.D.   On: 10/03/2018 15:13    ____________________________________________   PROCEDURES  Procedure(s) performed: None  Procedures  Critical Care performed: No  ____________________________________________   INITIAL IMPRESSION / ASSESSMENT AND PLAN / ED COURSE  As part of my medical decision making, I reviewed the following data within the electronic MEDICAL RECORD NUMBER Notes from prior ED visits and [place] Controlled Substance Database  Discussed x-ray findings with Dr. Rudene Christians who is the orthopedist on-call.  Patient will be admitted by hospitalist and Dr. Rudene Christians will plan for surgery tomorrow.  Patient was asked if any pain medication was needed and she answered that she does not like taking pain medication and wanted Tylenol only at this point.  She is aware that she can ask for stronger medication if needed.  ____________________________________________  FINAL CLINICAL IMPRESSION(S) / ED DIAGNOSES  Final diagnoses:  Closed fracture of neck of left femur, initial encounter [facility])     ED Discharge Orders    None       Note:  This document was prepared using Dragon voice recognition software and may include unintentional dictation errors.    Johnn Hai, PA-C 10/03/18 1707    Schuyler Amor, MD 10/04/18 [phone]

## 2018-10-03 NOTE — ED Notes (Signed)
Dr Menz at bedside. °

## 2018-10-03 NOTE — ED Triage Notes (Signed)
Presents vis ems s/p fall  States she was outside and her dog pulled at the leash  She fell forward  Landed on left hip area

## 2018-10-03 NOTE — ED Provider Notes (Signed)
ED EKG reviewed by myself and interpreted by myself, Dr. Reita Cliche MD  88 bpm.  Normal sinus rhythm.  Narrow QS.  Normal axis.  Nonspecific ST and T wave.   Lisa Roca, MD 10/03/18 [phone]
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
## 2018-10-04 ENCOUNTER — Inpatient Hospital Stay: Payer: Medicare HMO | Admitting: Certified Registered"

## 2018-10-04 ENCOUNTER — Inpatient Hospital Stay: Payer: Medicare HMO

## 2018-10-04 ENCOUNTER — Encounter
Admission: EM | Disposition: A | Discharge status: Home Health | Payer: Self-pay | Source: Home / Self Care | Attending: Internal Medicine

## 2018-10-04 ENCOUNTER — Encounter: Payer: Self-pay | Admitting: *Deleted

## 2018-10-04 HISTORY — PX: HIP PINNING,CANNULATED: SHX1758

## 2018-10-04 LAB — CBC
HCT: 36.2 % (ref 36.0–46.0)
HEMOGLOBIN: 11.6 g/dL — AB (ref 12.0–15.0)
MCH: 30.3 pg (ref 26.0–34.0)
MCHC: 32 g/dL (ref 30.0–36.0)
MCV: 94.5 fL (ref 80.0–100.0)
PLATELETS: 205 10*3/uL (ref 150–400)
RBC: 3.83 MIL/uL — AB (ref 3.87–5.11)
RDW: 12.8 % (ref 11.5–15.5)
WBC: 9.4 10*3/uL (ref 4.0–10.5)
nRBC: 0 % (ref 0.0–0.2)

## 2018-10-04 LAB — BASIC METABOLIC PANEL
Anion gap: 3 — ABNORMAL LOW (ref 5–15)
BUN: 15 mg/dL (ref 8–23)
CALCIUM: 8.2 mg/dL — AB (ref 8.9–10.3)
CHLORIDE: 107 mmol/L (ref 98–111)
CO2: 29 mmol/L (ref 22–32)
Creatinine, Ser: 0.47 mg/dL (ref 0.44–1.00)
Glucose, Bld: 109 mg/dL — ABNORMAL HIGH (ref 70–99)
Potassium: 3.7 mmol/L (ref 3.5–5.1)
SODIUM: 139 mmol/L (ref 135–145)

## 2018-10-04 SURGERY — FIXATION, FEMUR, NECK, PERCUTANEOUS, USING SCREW
Anesthesia: Spinal | Site: Hip | Laterality: Left

## 2018-10-04 MED ORDER — DOCUSATE SODIUM 100 MG PO CAPS
100.0000 mg | ORAL_CAPSULE | Freq: Two times a day (BID) | ORAL | Status: DC
  Administered 2018-10-04 – 2018-10-06 (×4): 100 mg via ORAL
  Filled 2018-10-04 (×5): qty 1

## 2018-10-04 MED ORDER — ENOXAPARIN SODIUM 40 MG/0.4ML ~~LOC~~ SOLN
40.0000 mg | SUBCUTANEOUS | Status: DC
  Administered 2018-10-05 – 2018-10-06 (×2): 40 mg via SUBCUTANEOUS
  Filled 2018-10-04 (×2): qty 0.4

## 2018-10-04 MED ORDER — FENTANYL CITRATE (PF) 100 MCG/2ML IJ SOLN: 25.0000 ug | INTRAMUSCULAR | Status: DC | PRN

## 2018-10-04 MED ORDER — HYDROCODONE-ACETAMINOPHEN 5-325 MG PO TABS
1.0000 | ORAL_TABLET | ORAL | Status: DC | PRN
  Administered 2018-10-05 – 2018-10-06 (×5): 1 via ORAL
  Filled 2018-10-04 (×3): qty 1

## 2018-10-04 MED ORDER — MIDAZOLAM HCL 5 MG/5ML IJ SOLN: INTRAMUSCULAR | Status: DC | PRN

## 2018-10-04 MED ORDER — PROMETHAZINE HCL 25 MG/ML IJ SOLN: 6.2500 mg | INTRAMUSCULAR | Status: DC | PRN

## 2018-10-04 MED ORDER — KETAMINE HCL 50 MG/ML IJ SOLN
INTRAMUSCULAR | Status: AC
  Filled 2018-10-04: qty 10

## 2018-10-04 MED ORDER — BISACODYL 10 MG RE SUPP: 10.0000 mg | Freq: Every day | RECTAL | Status: DC | PRN

## 2018-10-04 MED ORDER — PHENOL 1.4 % MT LIQD
1.0000 | OROMUCOSAL | Status: DC | PRN
  Filled 2018-10-04: qty 177

## 2018-10-04 MED ORDER — LIDOCAINE HCL (PF) 2 % IJ SOLN
INTRAMUSCULAR | Status: AC
  Filled 2018-10-04: qty 10

## 2018-10-04 MED ORDER — KETAMINE HCL 50 MG/ML IJ SOLN
INTRAMUSCULAR | Status: DC | PRN
  Administered 2018-10-04: 50 mg via INTRAMUSCULAR
  Administered 2018-10-04: 20 mg via INTRAMUSCULAR

## 2018-10-04 MED ORDER — SODIUM CHLORIDE 0.9 % IV SOLN
INTRAVENOUS | Status: DC
  Administered 2018-10-04 – 2018-10-05 (×2): via INTRAVENOUS

## 2018-10-04 MED ORDER — MAGNESIUM HYDROXIDE 400 MG/5ML PO SUSP
30.0000 mL | Freq: Every day | ORAL | Status: DC | PRN
  Administered 2018-10-05: 30 mL via ORAL
  Filled 2018-10-04: qty 30

## 2018-10-04 MED ORDER — MIDAZOLAM HCL 5 MG/5ML IJ SOLN
INTRAMUSCULAR | Status: DC | PRN
  Administered 2018-10-04: 1 mg via INTRAVENOUS

## 2018-10-04 MED ORDER — METHOCARBAMOL 500 MG PO TABS: 500.0000 mg | ORAL_TABLET | Freq: Four times a day (QID) | ORAL | Status: DC | PRN

## 2018-10-04 MED ORDER — LACTATED RINGERS IV SOLN
INTRAVENOUS | Status: DC
  Administered 2018-10-04: 14:00:00 via INTRAVENOUS

## 2018-10-04 MED ORDER — METHOCARBAMOL 1000 MG/10ML IJ SOLN
500.0000 mg | Freq: Four times a day (QID) | INTRAVENOUS | Status: DC | PRN
  Filled 2018-10-04: qty 5

## 2018-10-04 MED ORDER — FENTANYL CITRATE (PF) 100 MCG/2ML IJ SOLN
INTRAMUSCULAR | Status: AC
  Filled 2018-10-04: qty 2

## 2018-10-04 MED ORDER — SODIUM CHLORIDE 0.9 % IV SOLN
INTRAVENOUS | Status: DC | PRN
  Administered 2018-10-04: 25 ug/min via INTRAVENOUS

## 2018-10-04 MED ORDER — ZOLPIDEM TARTRATE 5 MG PO TABS: 5.0000 mg | ORAL_TABLET | Freq: Every evening | ORAL | Status: DC | PRN

## 2018-10-04 MED ORDER — CEFAZOLIN SODIUM-DEXTROSE 2-4 GM/100ML-% IV SOLN
2.0000 g | Freq: Four times a day (QID) | INTRAVENOUS | Status: AC
  Administered 2018-10-04 – 2018-10-05 (×3): 2 g via INTRAVENOUS
  Filled 2018-10-04 (×3): qty 100

## 2018-10-04 MED ORDER — MEPERIDINE HCL 50 MG/ML IJ SOLN: 6.2500 mg | INTRAMUSCULAR | Status: DC | PRN

## 2018-10-04 MED ORDER — METOCLOPRAMIDE HCL 5 MG/ML IJ SOLN: 5.0000 mg | Freq: Three times a day (TID) | INTRAMUSCULAR | Status: DC | PRN

## 2018-10-04 MED ORDER — BUPIVACAINE IN DEXTROSE 0.75-8.25 % IT SOLN
INTRATHECAL | Status: DC | PRN
  Administered 2018-10-04: 3 mL via INTRATHECAL

## 2018-10-04 MED ORDER — PROPOFOL 500 MG/50ML IV EMUL
INTRAVENOUS | Status: DC | PRN
  Administered 2018-10-04: 100 ug/kg/min via INTRAVENOUS

## 2018-10-04 MED ORDER — ACETAMINOPHEN 325 MG PO TABS: 325.0000 mg | ORAL_TABLET | Freq: Four times a day (QID) | ORAL | Status: DC | PRN

## 2018-10-04 MED ORDER — OXYCODONE HCL 5 MG/5ML PO SOLN: 5.0000 mg | Freq: Once | ORAL | Status: DC | PRN

## 2018-10-04 MED ORDER — ALUM & MAG HYDROXIDE-SIMETH 200-200-20 MG/5ML PO SUSP: 30.0000 mL | ORAL | Status: DC | PRN

## 2018-10-04 MED ORDER — MORPHINE SULFATE (PF) 2 MG/ML IV SOLN: 0.5000 mg | INTRAVENOUS | Status: DC | PRN

## 2018-10-04 MED ORDER — MAGNESIUM CITRATE PO SOLN
1.0000 | Freq: Once | ORAL | Status: DC | PRN
  Filled 2018-10-04: qty 296

## 2018-10-04 MED ORDER — CELECOXIB 200 MG PO CAPS
200.0000 mg | ORAL_CAPSULE | Freq: Two times a day (BID) | ORAL | Status: DC
  Administered 2018-10-04 – 2018-10-06 (×4): 200 mg via ORAL
  Filled 2018-10-04 (×5): qty 1

## 2018-10-04 MED ORDER — PROPOFOL 10 MG/ML IV BOLUS
INTRAVENOUS | Status: AC
  Filled 2018-10-04: qty 20

## 2018-10-04 MED ORDER — CEFAZOLIN SODIUM-DEXTROSE 2-3 GM-%(50ML) IV SOLR
INTRAVENOUS | Status: DC | PRN
  Administered 2018-10-04: 2 g via INTRAVENOUS

## 2018-10-04 MED ORDER — MIDAZOLAM HCL 2 MG/2ML IJ SOLN
INTRAMUSCULAR | Status: AC
  Filled 2018-10-04: qty 2

## 2018-10-04 MED ORDER — HYDROCODONE-ACETAMINOPHEN 7.5-325 MG PO TABS
1.0000 | ORAL_TABLET | ORAL | Status: DC | PRN
  Administered 2018-10-05: 1 via ORAL
  Filled 2018-10-04: qty 1

## 2018-10-04 MED ORDER — METOCLOPRAMIDE HCL 10 MG PO TABS: 5.0000 mg | ORAL_TABLET | Freq: Three times a day (TID) | ORAL | Status: DC | PRN

## 2018-10-04 MED ORDER — MENTHOL 3 MG MT LOZG
1.0000 | LOZENGE | OROMUCOSAL | Status: DC | PRN
  Filled 2018-10-04: qty 9

## 2018-10-04 MED ORDER — NEOMYCIN-POLYMYXIN B GU 40-200000 IR SOLN
Status: AC
  Filled 2018-10-04: qty 20

## 2018-10-04 MED ORDER — PROPOFOL 500 MG/50ML IV EMUL
INTRAVENOUS | Status: AC
  Filled 2018-10-04: qty 50

## 2018-10-04 MED ORDER — OXYCODONE HCL 5 MG PO TABS: 5.0000 mg | ORAL_TABLET | Freq: Once | ORAL | Status: DC | PRN

## 2018-10-04 MED ORDER — NEOMYCIN-POLYMYXIN B GU 40-200000 IR SOLN
Status: DC | PRN
  Administered 2018-10-04: 2 mL

## 2018-10-04 SURGICAL SUPPLY — 32 items
BIT DRILL CANN LRG QC 5X300 (BIT) ×3 IMPLANT
CANISTER SUCT 1200ML W/VALVE (MISCELLANEOUS) ×3 IMPLANT
CHLORAPREP W/TINT 26ML (MISCELLANEOUS) ×3 IMPLANT
COVER WAND RF STERILE (DRAPES) ×3 IMPLANT
DRSG TEGADERM 6X8 (GAUZE/BANDAGES/DRESSINGS) ×3 IMPLANT
ELECT REM PT RETURN 9FT ADLT (ELECTROSURGICAL) ×3
ELECTRODE REM PT RTRN 9FT ADLT (ELECTROSURGICAL) ×1 IMPLANT
GAUZE PETRO XEROFOAM 1X8 (MISCELLANEOUS) ×3 IMPLANT
GAUZE SPONGE 4X4 12PLY STRL (GAUZE/BANDAGES/DRESSINGS) ×3 IMPLANT
GLOVE BIOGEL PI IND STRL 9 (GLOVE) ×1 IMPLANT
GLOVE BIOGEL PI INDICATOR 9 (GLOVE) ×2
GLOVE SURG SYN 9.0  PF PI (GLOVE) ×2
GLOVE SURG SYN 9.0 PF PI (GLOVE) ×1 IMPLANT
GOWN SRG 2XL LVL 4 RGLN SLV (GOWNS) ×1 IMPLANT
GOWN STRL NON-REIN 2XL LVL4 (GOWNS) ×2
GOWN STRL REUS W/ TWL LRG LVL3 (GOWN DISPOSABLE) ×1 IMPLANT
GOWN STRL REUS W/TWL LRG LVL3 (GOWN DISPOSABLE) ×2
GUIDEWIRE THREADED 2.8MM (WIRE) ×12 IMPLANT
KIT TURNOVER KIT A (KITS) ×3 IMPLANT
NEEDLE FILTER BLUNT 18X 1/2SAF (NEEDLE) ×2
NEEDLE FILTER BLUNT 18X1 1/2 (NEEDLE) ×1 IMPLANT
NS IRRIG 500ML POUR BTL (IV SOLUTION) ×3 IMPLANT
PACK HIP COMPR (MISCELLANEOUS) ×3 IMPLANT
SCALPEL PROTECTED #10 DISP (BLADE) ×6 IMPLANT
SCREW CANN 32 THRD/90 7.3 (Screw) ×3 IMPLANT
SCREW CANN THREADED 7.3X85 (Screw) ×9 IMPLANT
STAPLER SKIN PROX 35W (STAPLE) ×3 IMPLANT
SUT PROLENE 2 0 FS (SUTURE) ×3 IMPLANT
SUT VIC AB 2-0 SH 27 (SUTURE) ×2
SUT VIC AB 2-0 SH 27XBRD (SUTURE) ×1 IMPLANT
SYR 5ML LL (SYRINGE) ×3 IMPLANT
TAPE MICROFOAM 4IN (TAPE) ×3 IMPLANT

## 2018-10-04 NOTE — Anesthesia Postprocedure Evaluation (Signed)
Anesthesia Post Note  Patient: Charlotte Webb  Procedure(s) Performed: CANNULATED HIP PINNING (Left Hip)  Patient location during evaluation: PACU Anesthesia Type: Spinal Level of consciousness: awake and alert Pain management: pain level controlled Vital Signs Assessment: post-procedure vital signs reviewed and stable Respiratory status: spontaneous breathing, nonlabored ventilation, respiratory function stable and patient connected to nasal cannula oxygen Cardiovascular status: blood pressure returned to baseline and stable Postop Assessment: no apparent nausea or vomiting Anesthetic complications: no     Last Vitals:  Vitals:   10/04/18 1748 10/04/18 1802  BP: 129/78 119/79  Pulse: 70 70  Resp: 17   Temp:  36.8 C  SpO2: 96% 98%    Last Pain:  Vitals:   10/04/18 1802  TempSrc: Oral  PainSc:                  [name]

## 2018-10-04 NOTE — NC FL2 (Signed)
[place] LEVEL OF CARE SCREENING TOOL     IDENTIFICATION  Patient Name: Charlotte Webb Birthdate: 12/17/1945 Sex: female Admission Date (Current Location): 10/03/2018  [place] and [place] Number:  Engineering geologist and Address:  [facility], [address] [place],  [address]      Provider Number: [id number]  Attending Physician Name and Address:  Demetrios Loll, MD  Relative Name and Phone Number:  Charline Hoskinson- husband [phone]    Current Level of Care: Hospital Recommended Level of Care: [place] Prior Approval Number:    Date Approved/Denied:   PASRR Number: [id number]  Discharge Plan: SNF    Current Diagnoses: Patient Active Problem List   Diagnosis Date Noted  . Hip fracture ([place]) 10/03/2018    Orientation RESPIRATION BLADDER Height & Weight     Self, Time, Situation, Place  Normal Continent Weight: 154 lb 15.7 oz (70.3 kg) Height:  5\' 7"  (170.2 cm)  BEHAVIORAL SYMPTOMS/MOOD NEUROLOGICAL BOWEL NUTRITION STATUS  (none) (none) Continent Diet(NPO to be advanced )  AMBULATORY STATUS COMMUNICATION OF NEEDS Skin   Extensive Assist Verbally Surgical wounds(Incision left hip )                       Personal Care Assistance Level of Assistance  Bathing, Feeding, Dressing Bathing Assistance: Limited assistance Feeding assistance: Independent Dressing Assistance: Limited assistance     Functional Limitations Info  Sight, Hearing, Speech Sight Info: Adequate Hearing Info: Adequate Speech Info: Adequate    SPECIAL CARE FACTORS FREQUENCY  PT (By licensed PT), OT (By licensed OT)     PT Frequency: 5 OT Frequency: 5            Contractures Contractures Info: Not present    Additional Factors Info  Code Status, Allergies Code Status Info: Full Code  Allergies Info: NKA           Current Medications (10/04/2018):  This is the current hospital active medication  list Current Facility-Administered Medications  Medication Dose Route Frequency Provider Last Rate Last Dose  . 0.9 %  sodium chloride infusion   Intravenous Continuous Dustin Flock, MD 75 mL/hr at 10/04/18 [phone]    . [MAR Hold] acetaminophen (TYLENOL) tablet 650 mg  650 mg Oral Q6H PRN Dustin Flock, MD       Or  . Doug Sou Hold] acetaminophen (TYLENOL) suppository 650 mg  650 mg Rectal Q6H PRN Dustin Flock, MD      . Doug Sou Hold] ceFAZolin (ANCEF) IVPB 1 g/50 mL premix  1 g Intravenous To OR Hessie Knows, MD      . [place] Hold] escitalopram (LEXAPRO) tablet 10 mg  10 mg Oral Daily Dustin Flock, MD      . Doug Sou Hold] HYDROcodone-acetaminophen (NORCO/VICODIN) 5-325 MG per tablet 1-2 tablet  1-2 tablet Oral Q4H PRN Dustin Flock, MD   1 tablet at 10/04/18 1019  . [MAR Hold] Influenza vac split quadrivalent PF (FLUZONE HIGH-DOSE) injection 0.5 mL  0.5 mL Intramuscular Tomorrow-1000 Dustin Flock, MD      . lactated ringers infusion   Intravenous Continuous [place], Amy, MD 75 mL/hr at 10/04/18 1401    . [MAR Hold] metoprolol succinate (TOPROL-XL) 24 hr tablet 12.5 mg  12.5 mg Oral BID Dustin Flock, MD   12.5 mg at 10/04/18 1019  . neomycin-polymyxin B (NEOSPORIN) irrigation solution    PRN Hessie Knows, MD   2 mL at 10/04/18 1546  . [MAR Hold] ondansetron (  ZOFRAN) tablet 4 mg  4 mg Oral Q6H PRN Dustin Flock, MD       Or  . Doug Sou Hold] ondansetron [facility] injection 4 mg  4 mg Intravenous Q6H PRN Dustin Flock, MD       Facility-Administered Medications Ordered in Other Encounters  Medication Dose Route Frequency Provider Last Rate Last Dose  . bupivacaine 0.75% in dextrose 8.25% (intrathecal) (SENSORCAINE) 0.75-8.25 % injection   Intrathecal Anesthesia Intra-op Nelda [place], CRNA   3 mL at 10/04/18 1559  . ceFAZolin (ANCEF) IVPB 2 g/50 mL premix   Intravenous Anesthesia Intra-op Nelda [place], CRNA   2 g at 10/04/18 1601  . ketamine (KETALAR) injection    Anesthesia Intra-op Nelda [place], CRNA   20 mg at 10/04/18 1554  . midazolam (VERSED) 5 MG/5ML injection    Anesthesia Intra-op Nelda [place], CRNA   1 mg at 10/04/18 1545  . phenylephrine (NEO-SYNEPHRINE) 100 mcg/mL in sodium chloride 0.9 % 100 mL infusion   Intravenous Continuous PRN Nelda [place], CRNA 6 mL/hr at 10/04/18 1623 10 mcg/min at 10/04/18 1623  . propofol (DIPRIVAN) 500 MG/50ML infusion    Continuous PRN Noles, Elta [place], CRNA 42.2 mL/hr at 10/04/18 1602 100 mcg/kg/min at 10/04/18 1602     Discharge Medications: Please see discharge summary for a list of discharge medications.  Relevant Imaging Results:  Relevant Lab Results:   Additional Information SSN: [id number]  Annamaria Boots, [place]

## 2018-10-04 NOTE — Anesthesia Post-op Follow-up Note (Signed)
Anesthesia QCDR form completed.        

## 2018-10-04 NOTE — Anesthesia Preprocedure Evaluation (Addendum)
Anesthesia Evaluation  Patient identified by MRN, date of birth, ID band Patient awake    Reviewed: Allergy & Precautions, NPO status , Patient's Chart, lab work & pertinent test results  History of Anesthesia Complications Negative for: history of anesthetic complications  Airway Mallampati: II  TM Distance: >3 FB Neck ROM: Full    Dental no notable dental hx.    Pulmonary neg pulmonary ROS, neg sleep apnea, neg COPD,    breath sounds clear to auscultation- rhonchi (-) wheezing      Cardiovascular hypertension, Pt. on medications (-) CAD, (-) Past MI, (-) Cardiac Stents and (-) CABG  Rhythm:Regular Rate:Normal - Systolic murmurs and - Diastolic murmurs    Neuro/Psych PSYCHIATRIC DISORDERS Anxiety Depression negative neurological ROS     GI/Hepatic negative GI ROS, Neg liver ROS,   Endo/Other  negative endocrine ROSneg diabetes  Renal/GU negative Renal ROS     Musculoskeletal negative musculoskeletal ROS (+)   Abdominal (+) - obese,   Peds  Hematology negative hematology ROS (+)   Anesthesia Other Findings Past Medical History: No date: Anxiety No date: Arrhythmia No date: Depression No date: Goiter No date: Hypertension No date: Mitral valve disorder No date: Osteoporosis   Reproductive/Obstetrics                             Lab Results  Component Value Date   WBC 9.4 10/04/2018   HGB 11.6 (L) 10/04/2018   HCT 36.2 10/04/2018   MCV 94.5 10/04/2018   PLT 205 10/04/2018    Anesthesia Physical Anesthesia Plan  ASA: II  Anesthesia Plan: Spinal   Post-op Pain Management:    Induction:   PONV Risk Score and Plan: 2 and Ondansetron, Midazolam and Propofol infusion  Airway Management Planned: Natural Airway  Additional Equipment:   Intra-op Plan:   Post-operative Plan:   Informed Consent: I have reviewed the patients History and Physical, chart, labs and discussed  the procedure including the risks, benefits and alternatives for the proposed anesthesia with the patient or authorized representative who has indicated his/her understanding and acceptance.   Dental advisory given  Plan Discussed with: CRNA and Anesthesiologist  Anesthesia Plan Comments:         Anesthesia Quick Evaluation

## 2018-10-04 NOTE — Progress Notes (Signed)
Pt dangled at bedside with no problem. Daughter at bedside

## 2018-10-04 NOTE — Progress Notes (Signed)
Dr fitzgerald aware and okay'ed pt to go to floor with spinal level at L1 and can move hips very slightly

## 2018-10-04 NOTE — Progress Notes (Signed)
Advanced Care Plan.  Purpose of Encounter: CODE STATUS. Parties in Attendance: The patient and me. Patient's Decisional Capacity: Yes. Medical Story:  Charlotte Webb  is a 72 y.o. female with a known history of anxiety, depression, hypertension and osteoporosis.  The patient is admitted for left hip fracture.  She is plan to get left hip surgery.  I discussed with patient about her current condition, risk and complications of surgery, prognosis and the colder status.  The patient stated that she want to be resuscitated and intubated if she has cardiopulmonary arrest. Plan:  Code Status: Full code. Time spent discussing advance care planning: 17 minutes.

## 2018-10-04 NOTE — Progress Notes (Addendum)
[place] [facility] [place] NAME: Charlotte Webb    MR#:  [id number]  DATE OF BIRTH:  [birth year]  SUBJECTIVE:  CHIEF COMPLAINT:   Chief Complaint  Patient presents with  . Fall   Left hip pain. REVIEW OF SYSTEMS:  Review of Systems  Constitutional: Negative for chills, fever and malaise/fatigue.  HENT: Negative for sore throat.   Eyes: Negative for blurred vision and double vision.  Respiratory: Negative for cough, hemoptysis, shortness of breath, wheezing and stridor.   Cardiovascular: Negative for chest pain, palpitations, orthopnea and leg swelling.  Gastrointestinal: Negative for abdominal pain, blood in stool, diarrhea, melena, nausea and vomiting.  Genitourinary: Negative for dysuria, flank pain and hematuria.  Musculoskeletal: Positive for joint pain. Negative for back pain.       Left hip pain.  Neurological: Negative for dizziness, sensory change, focal weakness, seizures, loss of consciousness, weakness and headaches.  Endo/Heme/Allergies: Negative for polydipsia.  Psychiatric/Behavioral: Negative for depression. The patient is not nervous/anxious.     DRUG ALLERGIES:  No Known Allergies VITALS:  Blood pressure (!) 142/71, pulse 71, temperature 98.3 F (36.8 C), temperature source Tympanic, resp. rate 12, height 5\' 7"  (1.702 m), weight 70.3 kg, SpO2 96 %. PHYSICAL EXAMINATION:  Physical Exam  Constitutional: She is oriented to person, place, and time. She appears well-developed. No distress.  HENT:  Head: Normocephalic.  Mouth/Throat: Oropharynx is clear and moist.  Eyes: Pupils are equal, round, and reactive to light. Conjunctivae and EOM are normal. No scleral icterus.  Neck: Normal range of motion. Neck supple. No JVD present. No tracheal deviation present.  Cardiovascular: Normal rate, regular rhythm and normal heart sounds. Exam reveals no gallop.  No murmur heard. Pulmonary/Chest: Effort normal and breath sounds  normal. No respiratory distress. She has no wheezes. She has no rales.  Abdominal: Soft. Bowel sounds are normal. She exhibits no distension. There is no tenderness. There is no rebound.  Musculoskeletal: She exhibits no edema or tenderness.  Unable to move left leg.  Neurological: She is alert and oriented to person, place, and time. No cranial nerve deficit.  Skin: No rash noted. No erythema.  Psychiatric: She has a normal mood and affect.   LABORATORY PANEL:  Female CBC Recent Labs  Lab 10/04/18 0419  WBC 9.4  HGB 11.6*  HCT 36.2  PLT 205   ------------------------------------------------------------------------------------------------------------------ Chemistries  Recent Labs  Lab 10/03/18 1624 10/04/18 0419  NA 140 139  K 3.4* 3.7  CL 104 107  CO2 28 29  GLUCOSE 125* 109*  BUN 14 15  CREATININE 0.58 0.47  CALCIUM 9.6 8.2*  AST 24  --   ALT 15  --   ALKPHOS 71  --   BILITOT 0.7  --    RADIOLOGY:  Dg Chest 1 View  Result Date: 10/03/2018 CLINICAL DATA:  Left hip fracture EXAM: CHEST  1 VIEW COMPARISON:  None. FINDINGS: The heart size and mediastinal contours are within normal limits. Both lungs are clear. The visualized skeletal structures are unremarkable. IMPRESSION: No active disease. Electronically Signed   By: Nelson Chimes M.D.   On: 10/03/2018 16:11   Dg Hip Unilat W Or Wo Pelvis 2-3 Views Left  Result Date: 10/03/2018 CLINICAL DATA:  Presents vis ems s/p fall States she was outside and her dog pulled at the leash She fell forward Landed on left hip area, history of osteoporosis, hypertension EXAM: DG HIP (WITH OR WITHOUT PELVIS) 2-3V LEFT COMPARISON:  None. FINDINGS: There is an acute subcapital femoral neck fracture of the LEFT hip, associated with impaction. No evidence for dislocation. The remainder of the pelvis is negative. IMPRESSION: Acute subcapital LEFT femoral neck fracture. Electronically Signed   By: Nolon Nations M.D.   On: 10/03/2018 15:13    ASSESSMENT AND PLAN:   Patient 72 year old status post fall with hip fracture  1.    Left hip fracture.  preop evaluation patient does not have any cardiac or pulmonary symptoms.  She has good functional status.  No further cardiopulmonary work-up needed.  Okay to proceed to surgery. Pain control, PT and DVT prophylaxis after surgery.  2.  Hypertension continue metoprolol  3.  Depression anxiety continue Lexapro  4.  Miscellaneous recommend DVT prophylaxis post procedure  Hypokalemia.  Improved. Mild anemia, possible due to acute blood loss secondary to hip fracture and IV fluid dilution.  Follow-up hemoglobin after surgery.  Possible discharge home with home health and PT. All the records are reviewed and case discussed with Care Management/Social Worker. Management plans discussed with the patient, family and they are in agreement.  CODE STATUS: Full Code  TOTAL TIME TAKING CARE OF THIS PATIENT: 23 minutes.   More than 50% of the time was spent in counseling/coordination of care: YES  POSSIBLE D/C IN 2 DAYS, DEPENDING ON CLINICAL CONDITION.   Demetrios Loll M.D on 10/04/2018 at 2:55 PM  Between 7am to 6pm - Pager - [phone]  After 6pm go to [email or web address] - [facility]

## 2018-10-04 NOTE — Op Note (Signed)
10/04/2018  4:58 PM  PATIENT:  Charlotte Webb  72 y.o. female  PRE-OPERATIVE DIAGNOSIS:  LEFT HIP FRACTURE impacted femoral neck  POST-OPERATIVE DIAGNOSIS:  LEFT HIP FRACTURE same PROCEDURE:  Procedure(s): CANNULATED HIP PINNING (Left)  SURGEON: Laurene Footman, MD  ASSISTANTS: None  ANESTHESIA:   spinal  EBL:  Total I/O In: 1574.4 [I.V.:1574.4] Out: 100 [Blood:100]  BLOOD ADMINISTERED:none  DRAINS: none   LOCAL MEDICATIONS USED:  NONE  SPECIMEN:  No Specimen  DISPOSITION OF SPECIMEN:  N/A  COUNTS:  YES  TOURNIQUET:  * No tourniquets in log *  IMPLANTS: 7.3 Synthes cannulated screw x4  DICTATION: .Dragon Dictation patient was brought to the operating room and after adequate anesthesia was obtained the patient was placed on the fracture table.  After right leg was placed in a well-leg holder and left foot in the traction boot with no traction applied C arm was brought in in good visualization was obtained.  The hip was prepped and draped using a barrier drape method.  After patient identification and timeout procedures were completed, a small lateral incision was made and the soft tissue protector brought down to the lateral femur and a guidewire inserted into essentially near center center position.  Measurement was made off of this was measured drilled tapped and an 85 mm long thread screw inserted based on this initial wire 3 additional wires were placed in a diamond fashion with similar process of measuring drilling tapping and placing the screws with one short threaded 85 mm screw and 3 long threaded a 5 mm screws.  AP and lateral imaging showed good position of the fracture and metal the wound was then irrigated after permanent C arm views were obtained with the skin closed with 2-0 Vicryl subcutaneously followed by staples and Xeroform ABDs and tape  PLAN OF CARE: Continue as inpatient  PATIENT DISPOSITION:  PACU - hemodynamically stable.

## 2018-10-04 NOTE — Transfer of Care (Signed)
Immediate Anesthesia Transfer of Care Note  Patient: Charlotte Webb  Procedure(s) Performed: CANNULATED HIP PINNING (Left Hip)  Patient Location: PACU  Anesthesia Type:Spinal  Level of Consciousness: sedated  Airway & Oxygen Therapy: Patient Spontanous Breathing and Patient connected to nasal cannula oxygen  Post-op Assessment: Report given to RN and Post -op Vital signs reviewed and stable  Post vital signs: Reviewed and stable  Last Vitals:  Vitals Value Taken Time  BP 122/68 10/04/2018  4:47 PM  Temp    Pulse 62 10/04/2018  4:47 PM  Resp 8 10/04/2018  4:47 PM  SpO2 99 % 10/04/2018  4:47 PM  Vitals shown include unvalidated device data.  Last Pain:  Vitals:   10/04/18 1347  TempSrc: Tympanic  PainSc: 7          Complications: No apparent anesthesia complications

## 2018-10-04 NOTE — Clinical Social Work Placement (Signed)
   CLINICAL SOCIAL WORK PLACEMENT  NOTE  Date:  10/04/2018  Patient Details  Name: Charlotte Webb MRN: [id number] Date of Birth: [birth year]  Clinical Social Work is seeking post-discharge placement for this patient at the [place] level of care (*CSW will initial, date and re-position this form in  chart as items are completed):  Yes   Patient/family provided with [facility] Work Department's list of facilities offering this level of care within the geographic area requested by the patient (or if unable, by the patient's family).  Yes   Patient/family informed of their freedom to choose among providers that offer the needed level of care, that participate in Medicare, Medicaid or managed care program needed by the patient, have an available bed and are willing to accept the patient.  Yes   Patient/family informed of [facility]'s ownership interest in [facility] and [facility], as well as of the fact that they are under no obligation to receive care at these facilities.  PASRR submitted to EDS on 10/04/18     PASRR number received on 10/04/18     Existing PASRR number confirmed on       FL2 transmitted to all facilities in geographic area requested by pt/family on 10/04/18     FL2 transmitted to all facilities within larger geographic area on       Patient informed that his/her managed care company has contracts with or will negotiate with certain facilities, including the following:        Yes   Patient/family informed of bed offers received.  Patient chooses bed at       Physician recommends and patient chooses bed at      Patient to be transferred to   on  .  Patient to be transferred to facility by       Patient family notified on   of transfer.  Name of family member notified:        PHYSICIAN       Additional Comment:    _______________________________________________ Rigoberto Repass, Veronia Beets, LCSW 10/04/2018, 4:57 PM

## 2018-10-04 NOTE — Anesthesia Procedure Notes (Signed)
Spinal  Patient location during procedure: OR Start time: 10/04/2018 3:45 PM End time: 10/04/2018 4:00 PM Staffing Anesthesiologist: [name], MD Resident/CRNA: Nelda Marseille, CRNA Performed: anesthesiologist and resident/CRNA  Preanesthetic Checklist Completed: patient identified, site marked, surgical consent, pre-op evaluation, timeout performed, IV checked, risks and benefits discussed and monitors and equipment checked Spinal Block Patient position: sitting Prep: ChloraPrep Patient monitoring: heart rate, continuous pulse ox, blood pressure and cardiac monitor Approach: midline Location: L4-5 Injection technique: single-shot Needle Needle type: Whitacre and Introducer  Needle gauge: 24 G Needle length: 9 cm Additional Notes Negative paresthesia. Negative blood return. Positive free-flowing CSF. Expiration date of kit checked and confirmed. Patient tolerated procedure well, without complications.

## 2018-10-04 NOTE — Clinical Social Work Note (Signed)
Clinical Social Work Assessment  Patient Details  Name: Charlotte Webb MRN: [id number] Date of Birth: 1946-01-14  Date of referral:  10/04/18               Reason for consult:  Facility Placement                Permission sought to share information with:  Chartered certified accountant granted to share information::  Yes, Verbal Permission Granted  Name::      Charlotte Webb::   [name]   Relationship::     Contact Information:     Housing/Transportation Living arrangements for the past 2 months:  [place] of Information:  Patient, Spouse Patient Interpreter Needed:  None Criminal Activity/Legal Involvement Pertinent to Current Situation/Hospitalization:  No - Comment as needed Significant Relationships:  Spouse Lives with:  Spouse Do you feel safe going back to the place where you live?  Yes Need for family participation in patient care:  Yes (Comment)  Care giving concerns:  Patient lives in [place] with her husband Charlotte Webb [phone].    Social Worker assessment / plan:  Holiday representative ([place]) reviewed chart and noted that patient has a hip fracture. Surgery and PT are pending. CSW met with patient and her husband Charlotte Webb was at bedside. CSW introduced self and explained role of CSW department. Patient was alert and oriented X4 and was laying in the bed. Per patient she lives in [place] with her husband. CSW explained that after surgery PT will evaluate her and make a recommendation of home health or SNF. CSW explained that [facility] will have to approve SNF. Patient is agreeable to SNF search in [facility] and prefers [facility]. Patient reported that she would like to D/C home if possible. FL2 complete and faxed out. CSW will continue to follow and assist as needed.   Employment status:  Retired Nurse, adult PT Recommendations:  Not assessed at this time [place] /  Referral to community resources:  [facility]  Patient/Family's Response to care:  Patient is agreeable to [facility] in [place].   Patient/Family's Understanding of and Emotional Response to Diagnosis, Current Treatment, and Prognosis:  Patient was very pleasant and thanked CSW for assistance.   Emotional Assessment Appearance:  Appears stated age Attitude/Demeanor/Rapport:    Affect (typically observed):  Accepting, Adaptable, Pleasant Orientation:  Oriented to Self, Oriented to Place, Oriented to  Time, Oriented to Situation Alcohol / Substance use:  Not Applicable Psych involvement (Current and /or in the community):  No (Comment)  Discharge Needs  Concerns to be addressed:  Discharge Planning Concerns Readmission within the last 30 days:  No Current discharge risk:  Dependent with Mobility Barriers to Discharge:  Continued Medical Work up   [employer], Veronia Beets, LCSW 10/04/2018, 4:57 PM

## 2018-10-05 LAB — BASIC METABOLIC PANEL
Anion gap: 5 (ref 5–15)
BUN: 7 mg/dL — ABNORMAL LOW (ref 8–23)
CALCIUM: 8.5 mg/dL — AB (ref 8.9–10.3)
CO2: 28 mmol/L (ref 22–32)
CREATININE: 0.55 mg/dL (ref 0.44–1.00)
Chloride: 107 mmol/L (ref 98–111)
Glucose, Bld: 99 mg/dL (ref 70–99)
Potassium: 3.8 mmol/L (ref 3.5–5.1)
SODIUM: 140 mmol/L (ref 135–145)

## 2018-10-05 LAB — CBC
HCT: 36 % (ref 36.0–46.0)
HEMOGLOBIN: 11.8 g/dL — AB (ref 12.0–15.0)
MCH: 30.9 pg (ref 26.0–34.0)
MCHC: 32.8 g/dL (ref 30.0–36.0)
MCV: 94.2 fL (ref 80.0–100.0)
Platelets: 207 10*3/uL (ref 150–400)
RBC: 3.82 MIL/uL — ABNORMAL LOW (ref 3.87–5.11)
RDW: 12.8 % (ref 11.5–15.5)
WBC: 9.5 10*3/uL (ref 4.0–10.5)
nRBC: 0 % (ref 0.0–0.2)

## 2018-10-05 MED ORDER — ASPIRIN 81 MG PO CHEW
81.0000 mg | CHEWABLE_TABLET | Freq: Every day | ORAL | Status: DC
  Administered 2018-10-06: 81 mg via ORAL
  Filled 2018-10-05: qty 1

## 2018-10-05 NOTE — Evaluation (Signed)
Physical Therapy Evaluation Patient Details Name: Charlotte Webb MRN: [id number] DOB: 1946/08/08 Today's Date: 10/05/2018   History of Present Illness  72 y/o female s/p fall with L hip fx.  ORIF pinning 10/04/18.  Clinical Impression  Pt did well with first PT session post-op.  She did not need any assist with supine to sit and only cuing and assist setting up to get to standing.  She was able to ambulate ~75 ft showing good improvement with cadence with gait training/cuing apart from the exam.  She also showed good overall post-op strength and ability to perform exercises well w/o excessive pain.     Follow Up Recommendations Home health PT    Equipment Recommendations  Rolling walker with 5" wheels;3in1 (PT)    Recommendations for Other Services       Precautions / Restrictions Precautions Precautions: Fall Restrictions Weight Bearing Restrictions: Yes LLE Weight Bearing: Partial weight bearing      Mobility  Bed Mobility Overal bed mobility: Modified Independent             General bed mobility comments: Pt slow to get to EOB, but did not need physical assist  Transfers Overall transfer level: Modified independent Equipment used: Rolling walker (2 wheeled)             General transfer comment: Pt able to rise to standing with good confidence from standard height surface.  Struggled minimally to get L LE underneath her, but able to do so w/o direct assist.  Ambulation/Gait Ambulation/Gait assistance: Min guard Gait Distance (Feet): 75 Feet Assistive device: Rolling walker (2 wheeled)       General Gait Details: Pt initially did not seem as though she would walk very far, but she was able to improve confidence with WBing and cadence and ultimately was able to show consistent cadence with appropriate walker use and did well.   Stairs            Wheelchair Mobility    Modified Rankin (Stroke Patients Only)       Balance Overall balance  assessment: Needs assistance Sitting-balance support: No upper extremity supported Sitting balance-Leahy Scale: Normal     Standing balance support: Bilateral upper extremity supported Standing balance-Leahy Scale: Fair Standing balance comment: Pt needing walker to maintain balance and did not readily shift all her weight to the L                             Pertinent Vitals/Pain Pain Assessment: 0-10 Pain Score: 4  Pain Location: L hip, increases with movement/activity    Home Living Family/patient expects to be discharged to:: Private residence Living Arrangements: Spouse/significant other Available Help at Discharge: Family Type of Home: House Home Access: Stairs to enter       Home Equipment: None      Prior Function Level of Independence: Independent         Comments: Pt normally able to be very active, out of the home, driving, etc     Hand Dominance        Extremity/Trunk Assessment   Upper Extremity Assessment Upper Extremity Assessment: Overall WFL for tasks assessed    Lower Extremity Assessment Lower Extremity Assessment: Overall WFL for tasks assessed(except expected L LE post-op weakness)       Communication   Communication: No difficulties  Cognition Arousal/Alertness: Awake/alert Behavior During Therapy: WFL for tasks assessed/performed Overall Cognitive Status: Within Functional Limits for tasks  assessed                                        General Comments      Exercises General Exercises - Lower Extremity Ankle Circles/Pumps: Strengthening;10 reps Quad Sets: Strengthening;10 reps Gluteal Sets: AROM;10 reps Long Arc Quad: Strengthening;5 reps Heel Slides: Strengthening;5 reps Hip ABduction/ADduction: Strengthening;5 reps Hip Flexion/Marching: Strengthening;5 reps   Assessment/Plan    PT Assessment Patient needs continued PT services  PT Problem List Decreased strength;Decreased range of  motion;Decreased activity tolerance;Decreased balance;Decreased mobility;Decreased coordination;Decreased knowledge of use of DME;Decreased safety awareness;Decreased knowledge of precautions;Pain       PT Treatment Interventions DME instruction;Gait training;Stair training;Functional mobility training;Therapeutic activities;Therapeutic exercise;Balance training;Neuromuscular re-education;Patient/family education    PT Goals (Current goals can be found in the Care Plan section)  Acute Rehab PT Goals Patient Stated Goal: go home PT Goal Formulation: With patient Time For Goal Achievement: 10/19/18 Potential to Achieve Goals: Good    Frequency BID   Barriers to discharge        Co-evaluation               AM-PAC PT "6 Clicks" Daily Activity  Outcome Measure Difficulty turning over in bed (including adjusting bedclothes, sheets and blankets)?: None Difficulty moving from lying on back to sitting on the side of the bed? : None Difficulty sitting down on and standing up from a chair with arms (e.g., wheelchair, bedside commode, etc,.)?: A Little Help needed moving to and from a bed to chair (including a wheelchair)?: None Help needed walking in hospital room?: A Little Help needed climbing 3-5 steps with a railing? : A Little 6 Click Score: 21    End of Session Equipment Utilized During Treatment: Gait belt Activity Tolerance: Patient tolerated treatment well Patient left: with chair alarm set;with call bell/phone within reach;with family/visitor present Nurse Communication: Mobility status PT Visit Diagnosis: Muscle weakness (generalized) (M62.81);Difficulty in walking, not elsewhere classified (R26.2)    Time: 5035-4656 PT Time Calculation (min) (ACUTE ONLY): 40 min   Charges:   PT Evaluation $PT Eval Low Complexity: 1 Low PT Treatments $Gait Training: 8-22 mins $Therapeutic Exercise: 8-22 mins        Kreg Shropshire, DPT 10/05/2018, 12:30 PM

## 2018-10-05 NOTE — Evaluation (Signed)
Occupational Therapy Evaluation Patient Details Name: Charlotte Webb MRN: [id number] DOB: 07/13/1946 Today's Date: 10/05/2018    History of Present Illness 72 y/o female PMH: anxiety, depression, HTN, and osteoporosis. Pt is s/p mechanical fall at home with L hip fx.  ORIF pinning 10/04/18.    Clinical Impression   Pt seen for OT evaluation this date, POD#1 from above surgery. Pt was independent in all ADLs prior fall and surgery. Pt is eager to return to PLOF with less pain and improved safety and independence. Pt is currently requiring FWW for fxl transfers and mobility and performs seated LB ADLs with increased time d/t pain in L hip. Pt instructed in potential AE use for self care if she experiences a pain increase, safe use of FWW, and falls prevention strategies.  Pt and family present verbalized understanding of education provided and pt does not appear to require OT f/u based on performance on assessment. No OT f/u recommended at this time.   Follow Up Recommendations  No OT follow up    Equipment Recommendations  None recommended by OT    Recommendations for Other Services       Precautions / Restrictions Precautions Precautions: Fall Restrictions Weight Bearing Restrictions: Yes LLE Weight Bearing: Weight bearing as tolerated      Mobility Bed Mobility Overal bed mobility:              General bed mobility comments: pt up in chair when OT presents  Transfers Overall transfer level: Modified independent Equipment used: Rolling walker (2 wheeled)             General transfer comment: to/from recliner    Balance Overall balance assessment: Needs assistance Sitting-balance support: No upper extremity supported Sitting balance-Leahy Scale: Normal     Standing balance support: Bilateral upper extremity supported Standing balance-Leahy Scale: Good Standing balance comment: Use of walker for stability d/t pain in L hip                          ADL either performed or assessed with clinical judgement   ADL Overall ADL's : Modified independent                                       General ADL Comments: Requires increased time to perform LB dressing ADLs d/t pain with hip flexion, but ultimately completes independently.      Vision Baseline Vision/History: No visual deficits Patient Visual Report: No change from baseline       Perception     Praxis      Pertinent Vitals/Pain Pain Assessment: 0-10 Pain Score: 2  Pain Location: 2 while seated in recliner Pain Descriptors / Indicators: Aching Pain Intervention(s): Monitored during session     Hand Dominance     Extremity/Trunk Assessment Upper Extremity Assessment Upper Extremity Assessment: Overall WFL for tasks assessed   Lower Extremity Assessment Lower Extremity Assessment: Defer to PT evaluation;LLE deficits/detail;RLE deficits/detail RLE Deficits / Details: WFL RLE Sensation: WNL RLE Coordination: WNL LLE Deficits / Details: Pt requires increased time to flex/ext hip, but ultimately completes fxl ROM for ADL t/f's.    Cervical / Trunk Assessment Cervical / Trunk Assessment: Normal   Communication Communication Communication: No difficulties   Cognition Arousal/Alertness: Awake/alert Behavior During Therapy: WFL for tasks assessed/performed Overall Cognitive Status: Within Functional Limits for tasks assessed  General Comments       Exercises  Other Exercises Other Exercises: Pt and family present educated on AE if need arises including sock aid and reacher. Verbalized understanding.  Other Exercises: Pt and family present educated on falls prevention strategies for the home environment. Verbalized understanding. Other Exercises: Pt and family educated on safe use of FWW for stability including hand/foot placement. Verbalized understanding.    Shoulder Instructions      Home  Living Family/patient expects to be discharged to:: Private residence Living Arrangements: Spouse/significant other Available Help at Discharge: Family Type of Home: House Home Access: Stairs to enter Technical brewer of Steps: 2   Home Layout: One level         Biochemist, clinical: Standard Bathroom Accessibility: Yes   Home Equipment: None   Additional Comments: Pt's husband has a 4WW that he does not use that can be adjusted to pt's height if needed.       Prior Functioning/Environment Level of Independence: Independent        Comments: Pt normally able to be very active, out of the home, driving, etc        OT Problem List: Decreased strength;Decreased range of motion      OT Treatment/Interventions:      OT Goals(Current goals can be found in the care plan section) Acute Rehab OT Goals Patient Stated Goal: go home OT Goal Formulation: All assessment and education complete, DC therapy  OT Frequency:     Barriers to D/C:            Co-evaluation              AM-PAC PT "6 Clicks" Daily Activity     Outcome Measure Help from another person eating meals?: None Help from another person taking care of personal grooming?: None Help from another person toileting, which includes using toliet, bedpan, or urinal?: None Help from another person bathing (including washing, rinsing, drying)?: None Help from another person to put on and taking off regular upper body clothing?: None Help from another person to put on and taking off regular lower body clothing?: None 6 Click Score: 24   End of Session Equipment Utilized During Treatment: Gait belt;Rolling walker  Activity Tolerance: Patient tolerated treatment well Patient left: in chair;with call bell/phone within reach;with chair alarm set;with family/visitor present  OT Visit Diagnosis: Other abnormalities of gait and mobility (R26.89);History of falling (Z91.81)                Time: 3845-3646 OT Time  Calculation (min): 30 min Charges:  OT General Charges $OT Visit: 1 Visit OT Evaluation $OT Eval Low Complexity: 1 Low OT Treatments $Self Care/Home Management : 8-22 mins  Gerrianne Scale, MS, OTR/L ascom [phone] or [phone] 10/05/18, 1:12 PM

## 2018-10-05 NOTE — Progress Notes (Signed)
[facility] NAME: Charlotte Webb    MR#:  [id number]  DATE OF BIRTH:  1946-08-10  SUBJECTIVE:  CHIEF COMPLAINT:   Chief Complaint  Patient presents with  . Fall   Better left hip pain. REVIEW OF SYSTEMS:  Review of Systems  Constitutional: Negative for chills, fever and malaise/fatigue.  HENT: Negative for sore throat.   Eyes: Negative for blurred vision and double vision.  Respiratory: Negative for cough, hemoptysis, shortness of breath, wheezing and stridor.   Cardiovascular: Negative for chest pain, palpitations, orthopnea and leg swelling.  Gastrointestinal: Negative for abdominal pain, blood in stool, diarrhea, melena, nausea and vomiting.  Genitourinary: Negative for dysuria, flank pain and hematuria.  Musculoskeletal: Negative for back pain and joint pain.       Left hip pain.  Neurological: Negative for dizziness, sensory change, focal weakness, seizures, loss of consciousness, weakness and headaches.  Endo/Heme/Allergies: Negative for polydipsia.  Psychiatric/Behavioral: Negative for depression. The patient is not nervous/anxious.     DRUG ALLERGIES:  No Known Allergies VITALS:  Blood pressure 130/75, pulse 76, temperature 98.3 F (36.8 C), temperature source Oral, resp. rate 16, height 5\' 7"  (1.702 m), weight 70.3 kg, SpO2 95 %. PHYSICAL EXAMINATION:  Physical Exam  Constitutional: She is oriented to person, place, and time. She appears well-developed. No distress.  HENT:  Head: Normocephalic.  Mouth/Throat: Oropharynx is clear and moist.  Eyes: Pupils are equal, round, and reactive to light. Conjunctivae and EOM are normal. No scleral icterus.  Neck: Normal range of motion. Neck supple. No JVD present. No tracheal deviation present.  Cardiovascular: Normal rate, regular rhythm and normal heart sounds. Exam reveals no gallop.  No murmur heard. Pulmonary/Chest: Effort normal and breath sounds normal. No  respiratory distress. She has no wheezes. She has no rales.  Abdominal: Soft. Bowel sounds are normal. She exhibits no distension. There is no tenderness. There is no rebound.  Musculoskeletal: She exhibits no edema or tenderness.  Neurological: She is alert and oriented to person, place, and time. No cranial nerve deficit.  Skin: No rash noted. No erythema.  Psychiatric: She has a normal mood and affect.   LABORATORY PANEL:  Female CBC Recent Labs  Lab 10/05/18 0616  WBC 9.5  HGB 11.8*  HCT 36.0  PLT 207   ------------------------------------------------------------------------------------------------------------------ Chemistries  Recent Labs  Lab 10/03/18 1624  10/05/18 0616  NA 140   < > 140  K 3.4*   < > 3.8  CL 104   < > 107  CO2 28   < > 28  GLUCOSE 125*   < > 99  BUN 14   < > 7*  CREATININE 0.58   < > 0.55  CALCIUM 9.6   < > 8.5*  AST 24  --   --   ALT 15  --   --   ALKPHOS 71  --   --   BILITOT 0.7  --   --    < > = values in this interval not displayed.   RADIOLOGY:  Dg Hip Operative Unilat W Or W/o Pelvis Left  Result Date: 10/04/2018 CLINICAL DATA:  Fracture, surgery EXAM: OPERATIVE LEFT HIP (WITH PELVIS IF PERFORMED) 4 VIEWS TECHNIQUE: Fluoroscopic spot image(s) were submitted for interpretation post-operatively. COMPARISON:  Pelvic radiograph 10/03/2018 FLUOROSCOPY TIME:  0 minutes 43 seconds Dose: 7.77 mGy FINDINGS: Osseous demineralization. Minimally displaced subcapital fracture of the LEFT femoral neck. 4 cannulated screws were placed across  the observed subcapital fracture of the LEFT femoral neck. No dislocation. IMPRESSION: Post pinning of the previously identified subcapital fracture of the LEFT femoral neck. Electronically Signed   By: Lavonia Dana M.D.   On: 10/04/2018 16:42   ASSESSMENT AND PLAN:   Patient 72 year old status post fall with hip fracture  1.    Left hip fracture.  preop evaluation patient does not have any cardiac or pulmonary  symptoms.  She has good functional status.  No further cardiopulmonary work-up needed.  Okay to proceed to surgery. Pain control, PT and DVT prophylaxis after surgery.  2.  Hypertension continue metoprolol  3.  Depression anxiety continue Lexapro  4.  Miscellaneous recommend DVT prophylaxis post procedure  Hypokalemia.  Improved. Mild anemia, possible due to acute blood loss secondary to hip fracture and IV fluid dilution.  Stable.  Possible discharge home with home health and PT. All the records are reviewed and case discussed with Care Management/Social Worker. Management plans discussed with the patient, her daughter and they are in agreement.  CODE STATUS: Full Code  TOTAL TIME TAKING CARE OF THIS PATIENT: 25 minutes.   More than 50% of the time was spent in counseling/coordination of care: YES  POSSIBLE D/C IN 1-2 DAYS, DEPENDING ON CLINICAL CONDITION.   Demetrios Loll M.D on 10/05/2018 at 2:19 PM  Between 7am to 6pm - Pager - [phone]  After 6pm go to [email or web address] - [facility]

## 2018-10-05 NOTE — Progress Notes (Signed)
Physical Therapy Treatment Patient Details Name: Charlotte Webb MRN: [id number] DOB: 07/30/1946 Today's Date: 10/05/2018    History of Present Illness 72 y/o female PMH: anxiety, depression, HTN, and osteoporosis. Pt is s/p mechanical fall at home with L hip fx.  ORIF pinning 10/04/18.     PT Comments    Pt did very well this afternoon, she was able to circumambulate the nurses' station, negotiate up/down steps, showed good confidence with mobility, transfer, exercises and overall is progressing very nicely.     Follow Up Recommendations  Home health PT     Equipment Recommendations  Rolling walker with 5" wheels;3in1 (PT)    Recommendations for Other Services       Precautions / Restrictions Precautions Precautions: Fall Restrictions Weight Bearing Restrictions: Yes LLE Weight Bearing: Weight bearing as tolerated    Mobility  Bed Mobility Overal bed mobility: Modified Independent             General bed mobility comments: able to get b/l LEs back into bed w/o direct assist, light use of UEs to self-assist  Transfers Overall transfer level: Independent Equipment used: Rolling walker (2 wheeled)             General transfer comment: Pt with much improved confidence with getting to/from standing, no issues  Ambulation/Gait Ambulation/Gait assistance: Supervision Gait Distance (Feet): 250 Feet Assistive device: Rolling walker (2 wheeled)       General Gait Details: Pt with consistent cadence t/o the effort. No LOBs, minimal use of UEs and no excessive fatigue or significant limp.   Stairs Stairs: Yes Stairs assistance: Supervision Stair Management: Backwards;No rails;With walker Number of Stairs: 4 General stair comments: Pt was able to negotiate up/down steps with husband assisting with walker guidance   Wheelchair Mobility    Modified Rankin (Stroke Patients Only)       Balance Overall balance assessment: Independent   Sitting  balance-Leahy Scale: Normal       Standing balance-Leahy Scale: Good                              Cognition Arousal/Alertness: Awake/alert Behavior During Therapy: WFL for tasks assessed/performed Overall Cognitive Status: Within Functional Limits for tasks assessed                                        Exercises General Exercises - Lower Extremity Ankle Circles/Pumps: Strengthening;10 reps Quad Sets: Strengthening;10 reps Gluteal Sets: AROM;10 reps Short Arc Quad: Strengthening;10 reps Heel Slides: Strengthening;10 reps Hip ABduction/ADduction: Strengthening;10 reps Straight Leg Raises: AROM;5 reps    General Comments        Pertinent Vitals/Pain Pain Assessment: 0-10 Pain Score: 4     Home Living                      Prior Function            PT Goals (current goals can now be found in the care plan section) Progress towards PT goals: Progressing toward goals    Frequency    BID      PT Plan Current plan remains appropriate    Co-evaluation              AM-PAC PT "6 Clicks" Daily Activity  Outcome Measure  Difficulty turning over in bed (including adjusting bedclothes, sheets  and blankets)?: None Difficulty moving from lying on back to sitting on the side of the bed? : None Difficulty sitting down on and standing up from a chair with arms (e.g., wheelchair, bedside commode, etc,.)?: None Help needed moving to and from a bed to chair (including a wheelchair)?: None Help needed walking in hospital room?: None Help needed climbing 3-5 steps with a railing? : A Little 6 Click Score: 23    End of Session Equipment Utilized During Treatment: Gait belt Activity Tolerance: Patient tolerated treatment well Patient left: with call bell/phone within reach;with family/visitor present;with bed alarm set Nurse Communication: Mobility status PT Visit Diagnosis: Muscle weakness (generalized) (M62.81);Difficulty in  walking, not elsewhere classified (R26.2)     Time: 0630-1601 PT Time Calculation (min) (ACUTE ONLY): 25 min  Charges:  $Gait Training: 8-22 mins $Therapeutic Exercise: 8-22 mins                     Kreg Shropshire, DPT 10/05/2018, 6:07 PM

## 2018-10-05 NOTE — Progress Notes (Signed)
   Subjective: 1 Day Post-Op Procedure(s) (LRB): CANNULATED HIP PINNING (Left) Patient reports pain as moderate.   Patient is well, and has had no acute complaints or problems We will start therapy today.  Plan is to go Home after hospital stay. no nausea and no vomiting Patient denies any chest pains or shortness of breath. Objective: Vital signs in last 24 hours: Temp:  [97.9 F (36.6 C)-99.4 F (37.4 C)] 98.5 F (36.9 C) (11/09 0407) Pulse Rate:  [64-90] 71 (11/09 0407) Resp:  [12-18] 16 (11/09 0407) BP: (110-151)/(66-98) 127/66 (11/09 0407) SpO2:  [94 %-100 %] 94 % (11/09 0407) Weight:  [70.3 kg] 70.3 kg (11/08 1347) Heels are non tender and elevated off the bed using rolled towels Intake/Output from previous day: 11/08 0701 - 11/09 0700 In: 1783.8 [I.V.:1783.8] Out: 1825 [Urine:1725; Blood:100] Intake/Output this shift: No intake/output data recorded.  Recent Labs    10/03/18 1624 10/04/18 0419 10/05/18 0616  HGB 13.3 11.6* 11.8*   Recent Labs    10/04/18 0419 10/05/18 0616  WBC 9.4 9.5  RBC 3.83* 3.82*  HCT 36.2 36.0  PLT 205 207   Recent Labs    10/04/18 0419 10/05/18 0616  NA 139 140  K 3.7 3.8  CL 107 107  CO2 29 28  BUN 15 7*  CREATININE 0.47 0.55  GLUCOSE 109* 99  CALCIUM 8.2* 8.5*   No results for input(s): LABPT, INR in the last 72 hours.  EXAM General - Patient is Alert, Appropriate and Oriented Extremity - Neurologically intact Neurovascular intact Sensation intact distally Intact pulses distally Dorsiflexion/Plantar flexion intact No cellulitis present Compartment soft Dressing - dressing C/D/I Motor Function - intact, moving foot and toes well on exam.    Past Medical History:  Diagnosis Date  . Anxiety   . Arrhythmia   . Depression   . Goiter   . Hypertension   . Mitral valve disorder   . Osteoporosis     Assessment/Plan: 1 Day Post-Op Procedure(s) (LRB): CANNULATED HIP PINNING (Left) Active Problems:   Hip  fracture (HCC)  Estimated body mass index is 24.27 kg/m as calculated from the following:   Height as of this encounter: 5\' 7"  (1.702 m).   Weight as of this encounter: 70.3 kg. Advance diet Up with therapy D/C IV fluids Plan for discharge tomorrow Discharge home with home health  Labs: Hemoglobin 13.0 DVT Prophylaxis - Aspirin, Lovenox, Foot Pumps and TED hose Weight-Bearing as tolerated to left leg D/C O2 and Pulse OX and try on Room Air Again working on bowel movement  Webb Weed R. [place] [facility] 10/05/2018, 8:17 AM

## 2018-10-06 MED ORDER — BISACODYL 10 MG RE SUPP
10.0000 mg | Freq: Every day | RECTAL | Status: DC | PRN
  Administered 2018-10-06: 10 mg via RECTAL
  Filled 2018-10-06: qty 1

## 2018-10-06 MED ORDER — LACTULOSE 10 GM/15ML PO SOLN
10.0000 g | Freq: Two times a day (BID) | ORAL | Status: DC | PRN
  Administered 2018-10-06: 10 g via ORAL
  Filled 2018-10-06: qty 30

## 2018-10-06 MED ORDER — HYDROCODONE-ACETAMINOPHEN 5-325 MG PO TABS: 1.0000 | ORAL_TABLET | Freq: Four times a day (QID) | ORAL | 0 refills | Status: AC | PRN

## 2018-10-06 MED ORDER — ENOXAPARIN SODIUM 40 MG/0.4ML ~~LOC~~ SOLN: 40.0000 mg | SUBCUTANEOUS | 0 refills | Status: DC

## 2018-10-06 NOTE — Care Management Note (Signed)
Case Management Note  Patient Details  Name: Charlotte Webb MRN: [id number] Date of Birth: 11/30/1945  Subjective/Objective:   Patient to be discharged per MD order. Orders in place for home health services. Patient and daughter agreeable to home health and given choice they prefer [facility]. Referral with Helene Kelp who agrees to accept the patient for PT services. DME orders for a rolling walker. Obtain RW from [place] [facility] and it was delivered to the room. No further needs. Daughter to assist with transport.  Ines Bloomer RN BSN RNCM [phone]                  Action/Plan:   Expected Discharge Date:  10/06/18               Expected Discharge Plan:  [place]  In-House Referral:     Discharge planning Services  CM Consult  Post Acute Care Choice:  Home Health, Durable Medical Equipment Choice offered to:  Patient  DME Arranged:  Walker rolling DME Agency:  [place]:  PT [place]:  [facility]  Status of Service:  Completed, signed off  If discussed at [employer] of Stay Meetings, dates discussed:    Additional Comments:  Cervando Durnin A Ezreal Turay, RN 10/06/2018, 8:30 AM

## 2018-10-06 NOTE — Progress Notes (Signed)
   Subjective: 2 Days Post-Op Procedure(s) (LRB): CANNULATED HIP PINNING (Left) Patient reports pain as mild.   Patient is well, and has had no acute complaints or problems Patient did very well with therapy yesterday.  Was able to ambulate 250 feet as well as 2 steps. Plan is to go Home after hospital stay. no nausea and no vomiting Patient denies any chest pains or shortness of breath. Objective: Vital signs in last 24 hours: Temp:  [97.6 F (36.4 C)-98.6 F (37 C)] 97.6 F (36.4 C) (11/10 0731) Pulse Rate:  [67-82] 72 (11/10 0731) Resp:  [18] 18 (11/10 0731) BP: (115-145)/(68-88) 138/73 (11/10 0731) SpO2:  [93 %-98 %] 98 % (11/10 0731) well approximated incision Heels are non tender and elevated off the bed using rolled towels Intake/Output from previous day: 11/09 0701 - 11/10 0700 In: 733.5 [P.O.:120; I.V.:613.5] Out: 300 [Urine:300] Intake/Output this shift: No intake/output data recorded.  Recent Labs    10/03/18 1624 10/04/18 0419 10/05/18 0616  HGB 13.3 11.6* 11.8*   Recent Labs    10/04/18 0419 10/05/18 0616  WBC 9.4 9.5  RBC 3.83* 3.82*  HCT 36.2 36.0  PLT 205 207   Recent Labs    10/04/18 0419 10/05/18 0616  NA 139 140  K 3.7 3.8  CL 107 107  CO2 29 28  BUN 15 7*  CREATININE 0.47 0.55  GLUCOSE 109* 99  CALCIUM 8.2* 8.5*   No results for input(s): LABPT, INR in the last 72 hours.  EXAM General - Patient is Alert, Appropriate and Oriented Extremity - Neurologically intact Neurovascular intact Sensation intact distally Intact pulses distally Dorsiflexion/Plantar flexion intact No cellulitis present Compartment soft Dressing - dressing C/D/I Motor Function - intact, moving foot and toes well on exam.    Past Medical History:  Diagnosis Date  . Anxiety   . Arrhythmia   . Depression   . Goiter   . Hypertension   . Mitral valve disorder   . Osteoporosis     Assessment/Plan: 2 Days Post-Op Procedure(s) (LRB): CANNULATED HIP  PINNING (Left) Active Problems:   Hip fracture (HCC)  Estimated body mass index is 24.27 kg/m as calculated from the following:   Height as of this encounter: 5\' 7"  (1.702 m).   Weight as of this encounter: 70.3 kg. Up with therapy Plan for discharge tomorrow  Labs: None DVT Prophylaxis - Aspirin, Lovenox, Foot Pumps and TED hose Weight-Bearing as tolerated to left leg Patient needs a bowel movement prior to being discharged Dressing changed today  Crescencio Jozwiak R. [place] 10/06/2018, 7:50 AM

## 2018-10-06 NOTE — Discharge Instructions (Signed)
° °HIP FRACTURE POSTOPERATIVE DIRECTIONS ° °Hip Rehabilitation, Guidelines Following Surgery  °The results of a hip operation are greatly improved after range of motion and muscle strengthening exercises. Follow all safety measures which are given to protect your hip. If any of these exercises cause increased pain or swelling in your joint, decrease the amount until you are comfortable again. Then slowly increase the exercises. Call your caregiver if you have problems or questions.  ° °HOME CARE INSTRUCTIONS  °Remove items at home which could result in a fall. This includes throw rugs or furniture in walking pathways.  °· ICE to the affected hip every three hours for 30 minutes at a time and then as needed for pain and swelling.  Continue to use ice on the hip for pain and swelling from surgery. You may notice swelling that will progress down to the foot and ankle.  This is normal after surgery.  Elevate the leg when you are not up walking on it.   °· Continue to use the breathing machine which will help keep your temperature down.  It is common for your temperature to cycle up and down following surgery, especially at night when you are not up moving around and exerting yourself.  The breathing machine keeps your lungs expanded and your temperature down. ° °DIET °You may resume your previous home diet once your are discharged from the hospital. If you go to a rehab facility after surgery you will need to be on the diet appropriate for your medical history.  ° °DRESSING / WOUND  CARE / SHOWERING ° °DO NOT GET THE INCISION WET °You may start showering once staples have been removed at 2 weeks. Change dressing as needed. Do not submerge the incision in water such as a bath tub, swimming pool or hot tubs until the incision is completely healed which is approximately 4 weeks. ° ° °ACTIVITY °Walk with your walker as instructed. °Use walker as long as suggested by your caregivers.May go to using a cane once your  therapist feels that it is safe to do so. °Avoid periods of inactivity such as sitting longer than an hour when not asleep. This helps prevent blood clots.  °You may resume a sexual relationship in one month or when given the OK by your doctor.  °You may return to work once you are cleared by your doctor.  °Do not drive a car for 6 weeks or until released by you surgeon.  °Do not drive while taking narcotics. ° ° °WEIGHT BEARING °You may wait bear as tolerated on the surgical leg. ° °POSTOPERATIVE CONSTIPATION PROTOCOL °Constipation - defined medically as fewer than three stools per week and severe constipation as less than one stool per week. ° °One of the most common issues patients have following surgery is constipation.  Even if you have a regular bowel pattern at home, your normal regimen is likely to be disrupted due to multiple reasons following surgery.  Combination of anesthesia, postoperative narcotics, change in appetite and fluid intake all can affect your bowels.  In order to avoid complications following surgery, here are some recommendations in order to help you during your recovery period. ° °Colace (docusate) - Pick up an over-the-counter form of Colace or another stool softener and take twice a day as long as you are requiring postoperative pain medications.  Take with a full glass of water daily.  If you experience loose stools or diarrhea, hold the colace until you stool forms back up.    If your symptoms do not get better within 1 week or if they get worse, check with your doctor. ° °Dulcolax (bisacodyl) - Pick up over-the-counter and take as directed by the product packaging as needed to assist with the movement of your bowels.  Take with a full glass of water.  Use this product as needed if not relieved by Colace only.  ° °MiraLax (polyethylene glycol) - Pick up over-the-counter to have on hand.  MiraLax is a solution that will increase the amount of water in your bowels to assist with bowel  movements.  Take as directed and can mix with a glass of water, juice, soda, coffee, or tea.  Take if you go more than two days without a movement. °Do not use MiraLax more than once per day. Call your doctor if you are still constipated or irregular after using this medication for 7 days in a row. ° °If you continue to have problems with postoperative constipation, please contact the office for further assistance and recommendations.  If you experience "the worst abdominal pain ever" or develop nausea or vomiting, please contact the office immediatly for further recommendations for treatment. ° °ITCHING ° If you experience itching with your medications, try taking only a single pain pill, or even half a pain pill at a time.  You can also use Benadryl over the counter for itching or also to help with sleep.  ° °TED HOSE STOCKINGS °Wear the elastic stockings on both legs. If you go home, you may remove these at night but will need to put them on the first thing in the morning. If you go to rehab, then you may remove them one hour per 8 hour shift. This is because in rehab you are not as active as you are at home. ° °MEDICATIONS °See your medication summary on the “After Visit Summary” that the nursing staff will review with you prior to discharge.  You may have some home medications which will be placed on hold until you complete the course of blood thinner medication.  It is important for you to complete the blood thinner medication as prescribed by your surgeon.  Continue your approved medications as instructed at time of discharge. ° °PRECAUTIONS °If you experience chest pain or shortness of breath - call 911 immediately for transfer to the hospital emergency department.  °If you develop a fever greater that 101 F, purulent drainage from wound, increased redness or drainage from wound, foul odor from the wound/dressing, or calf pain - CONTACT YOUR SURGEON.   °                                                 °FOLLOW-UP APPOINTMENTS °Make sure you keep all of your appointments after your operation with your surgeon and caregivers. You should call the office at the above phone number and make an appointment for approximately 6 weeks after the date of your surgery or on the date instructed by your surgeon outlined in the "After Visit Summary".  Sooner if there is complication. ° °RANGE OF MOTION AND STRENGTHENING EXERCISES  °These exercises are designed to help you keep full movement of your hip joint. Follow your caregiver's or physical therapist's instructions. Perform all exercises about fifteen times, three times per day or as directed. Exercise both hips, even if you have had only one joint   replacement. These exercises can be done on a training (exercise) mat, on the floor, on a table or on a bed. Use whatever works the best and is most comfortable for you. Use music or television while you are exercising so that the exercises are a pleasant break in your day. This will make your life better with the exercises acting as a break in routine you can look forward to.  °Lying on your back, slowly slide your foot toward your buttocks, raising your knee up off the floor. Then slowly slide your foot back down until your leg is straight again.  °Lying on your back spread your legs as far apart as you can without causing discomfort.  °Lying on your side, raise your upper leg and foot straight up from the floor as far as is comfortable. Slowly lower the leg and repeat.  °Lying on your back, tighten up the muscle in the front of your thigh (quadriceps muscles). You can do this by keeping your leg straight and trying to raise your heel off the floor. This helps strengthen the largest muscle supporting your knee.  °Lying on your back, tighten up the muscles of your buttocks both with the legs straight and with the knee bent at a comfortable angle while keeping your heel on the floor.  ° °  ° ° °IF YOU ARE TRANSFERRED TO A SKILLED  REHAB FACILITY °If the patient is transferred to a skilled rehab facility following release from the hospital, a list of the current medications will be sent to the facility for the patient to continue.  When discharged from the skilled rehab facility, please have the facility set up the patient's Home Health Physical Therapy prior to being released. Also, the skilled facility will be responsible for providing the patient with their medications at time of release from the facility to include their pain medication, the muscle relaxants, and their blood thinner medication. If the patient is still at the rehab facility at time of the two week follow up appointment, the skilled rehab facility will also need to assist the patient in arranging follow up appointment in our office and any transportation needs. ° °MAKE SURE YOU:  °Understand these instructions.  °Get help right away if you are not doing well or get worse.  ° ° °Pick up stool softner and laxative for home use following surgery while on pain medications. °Continue to use ice for pain and swelling after surgery. °Do not use any lotions or creams on the incision until instructed by your surgeon. ° °

## 2018-10-06 NOTE — Progress Notes (Signed)
Physical Therapy Treatment Patient Details Name: Charlotte Webb MRN: [id number] DOB: 1946/02/24 Today's Date: 10/06/2018    History of Present Illness 72 y/o female PMH: anxiety, depression, HTN, and osteoporosis. Pt is s/p mechanical fall at home with L hip fx.  ORIF pinning 10/04/18.     PT Comments    Pt did very well with POD2 session, easily circumambulated the nurses' station with only minimal cuing and improved cadence, speed and confidence.  She is also showing increased strength with LE exercises and did not have excessive increase in pain with all tasks.  Overall she did very well and is able to return home as planned.    Follow Up Recommendations  Home health PT     Equipment Recommendations  Rolling walker with 5" wheels;3in1 (PT)    Recommendations for Other Services       Precautions / Restrictions Precautions Precautions: Fall Restrictions LLE Weight Bearing: Weight bearing as tolerated    Mobility  Bed Mobility               General bed mobility comments: in recliner, not tested  Transfers Overall transfer level: Independent Equipment used: Rolling walker (2 wheeled)             General transfer comment: Minimal reminders for hand placement, no issues getting to/from standing  Ambulation/Gait Ambulation/Gait assistance: Supervision Gait Distance (Feet): 200 Feet Assistive device: Rolling walker (2 wheeled)       General Gait Details: Pt easily circumambulated the nursing station with increased speed, step length, cadence and confidence.  No safety issues and did not have heavy UE reliance on the walker.    Stairs             Wheelchair Mobility    Modified Rankin (Stroke Patients Only)       Balance                                            Cognition Arousal/Alertness: Awake/alert Behavior During Therapy: WFL for tasks assessed/performed Overall Cognitive Status: Within Functional Limits for tasks  assessed                                        Exercises General Exercises - Lower Extremity Ankle Circles/Pumps: Strengthening;10 reps Quad Sets: Strengthening;10 reps Gluteal Sets: AROM;10 reps Long Arc Quad: Strengthening;10 reps Heel Slides: Strengthening;10 reps Hip ABduction/ADduction: Strengthening;10 reps Straight Leg Raises: Strengthening;10 reps    General Comments        Pertinent Vitals/Pain Pain Score: 4     Home Living                      Prior Function            PT Goals (current goals can now be found in the care plan section) Progress towards PT goals: Progressing toward goals    Frequency    BID      PT Plan Current plan remains appropriate    Co-evaluation              AM-PAC PT "6 Clicks" Daily Activity  Outcome Measure  Difficulty turning over in bed (including adjusting bedclothes, sheets and blankets)?: None Difficulty moving from lying on back to sitting on the side of  the bed? : None Difficulty sitting down on and standing up from a chair with arms (e.g., wheelchair, bedside commode, etc,.)?: None Help needed moving to and from a bed to chair (including a wheelchair)?: None Help needed walking in hospital room?: None Help needed climbing 3-5 steps with a railing? : None 6 Click Score: 24    End of Session Equipment Utilized During Treatment: Gait belt Activity Tolerance: Patient tolerated treatment well Patient left: with call bell/phone within reach;with family/visitor present;with chair alarm set Nurse Communication: Mobility status PT Visit Diagnosis: Muscle weakness (generalized) (M62.81);Difficulty in walking, not elsewhere classified (R26.2)     Time: 3383-2919 PT Time Calculation (min) (ACUTE ONLY): 26 min  Charges:  $Gait Training: 8-22 mins $Therapeutic Exercise: 8-22 mins                     Kreg Shropshire, DPT 10/06/2018, 12:15 PM

## 2018-10-06 NOTE — Progress Notes (Signed)
Patient discharged to home today with HH/PT.  DC & RX instructions given and patient acknowledged understanding. Lovenox education given to patient and daughter acknowledged understanding.  IV removed, belongings packed. NT took patient out at discharge.

## 2018-10-06 NOTE — Discharge Summary (Signed)
Physician Discharge Summary  Patient ID: Charlotte Webb MRN: [id number] DOB/AGE: 70-31-1947 70 y.o.  Admit date: 10/03/2018 Discharge date: 10/06/2018  Admission Diagnoses:  Closed fracture of neck of left femur, initial encounter [facility]) [S72.002A]   Discharge Diagnoses: Patient Active Problem List   Diagnosis Date Noted  . Hip fracture ([place]) 10/03/2018    Past Medical History:  Diagnosis Date  . Anxiety   . Arrhythmia   . Depression   . Goiter   . Hypertension   . Mitral valve disorder   . Osteoporosis      Transfusion: No transfusions during this admission   Consultants (if any): Treatment Team:  Charlotte Knows, MD Charlotte Loll, MD  Discharged Condition: Improved  Hospital Course: Charlotte Webb is an 71 y.o. female who was admitted 10/03/2018 with a diagnosis of left hip fracture and went to the operating room on 10/04/2018 and underwent the above named procedures.    Surgeries:Procedure(s): CANNULATED HIP PINNING on 10/04/2018  PRE-OPERATIVE DIAGNOSIS:  LEFT HIP FRACTURE impacted femoral neck  POST-OPERATIVE DIAGNOSIS:  LEFT HIP FRACTURE same PROCEDURE:  Procedure(s): CANNULATED HIP PINNING (Left)  SURGEON: Laurene Footman, MD  ASSISTANTS: None  ANESTHESIA:   spinal  EBL:  Total I/O In: 1574.4 [I.V.:1574.4] Out: 100 [Blood:100]  BLOOD ADMINISTERED:none  DRAINS: none   LOCAL MEDICATIONS USED:  NONE  SPECIMEN:  No Specimen  DISPOSITION OF SPECIMEN:  N/A  COUNTS:  YES  TOURNIQUET:  * No tourniquets in log *  IMPLANTS: 7.3 Synthes cannulated screw x4  Patient tolerated the surgery well. No complications .Patient was taken to PACU where she was stabilized and then transferred to the orthopedic floor.  Patient started on Lovenox 30 mg q 12 hrs as well as aspirin.. Foot pumps applied bilaterally at 80 mm hgb. Heels elevated off bed with rolled towels. No evidence of DVT. Calves non tender. Negative Homan. Physical therapy started  on day #1 for gait training and transfer with OT starting on  day #1 for ADL and assisted devices. Patient has done well with therapy. Ambulated 250 feet upon being discharged.  Was able to ambulate up and down 4 steps safely and independently  Patient's IV And Foley were discontinued on day #1 with Hemovac being discontinued on day #2. Dressing was changed on day 2 prior to patient being discharged   She was given perioperative antibiotics:  Anti-infectives (From admission, onward)   Start     Dose/Rate Route Frequency Ordered Stop   10/04/18 2200  ceFAZolin (ANCEF) IVPB 2g/100 mL premix     2 g 200 mL/hr over 30 Minutes Intravenous Every 6 hours 10/04/18 1801 10/05/18 1233   10/04/18 1300  ceFAZolin (ANCEF) powder 1 g  Status:  Discontinued     1 g Other To Surgery 10/03/18 1555 10/03/18 1704   10/04/18 1300  ceFAZolin (ANCEF) IVPB 1 g/50 mL premix  Status:  Discontinued     1 g 100 mL/hr over 30 Minutes Intravenous To Surgery 10/03/18 1704 10/05/18 1300    .  She was fitted with AV 1 compression foot pump devices, instructed on heel pumps, early ambulation, and fitted with TED stockings bilaterally for DVT prophylaxis.  She benefited maximally from the hospital stay and there were no complications.    Recent vital signs:  Vitals:   10/05/18 2354 10/06/18 0731  BP: 115/68 138/73  Pulse: 75 72  Resp: 18 18  Temp: 98.6 F (37 C) 97.6 F (36.4 C)  SpO2: 97% 98%  Recent laboratory studies:  Lab Results  Component Value Date   HGB 11.8 (L) 10/05/2018   HGB 11.6 (L) 10/04/2018   HGB 13.3 10/03/2018   Lab Results  Component Value Date   WBC 9.5 10/05/2018   PLT 207 10/05/2018   No results found for: INR Lab Results  Component Value Date   NA 140 10/05/2018   K 3.8 10/05/2018   CL 107 10/05/2018   CO2 28 10/05/2018   BUN 7 (L) 10/05/2018   CREATININE 0.55 10/05/2018   GLUCOSE 99 10/05/2018    Discharge Medications:   Allergies as of 10/06/2018   No Known  Allergies     Medication List    TAKE these medications   acetaminophen 500 MG tablet Commonly known as:  TYLENOL Take 500 mg by mouth every 4 (four) hours as needed for mild pain or moderate pain.   aspirin EC 81 MG tablet Take 81 mg by mouth every evening.   CALCIUM CARBONATE-VITAMIN D3 PO Take 500 mg by mouth.   enoxaparin 40 MG/0.4ML injection Commonly known as:  LOVENOX Inject 0.4 mLs (40 mg total) into the skin daily for 12 days.   escitalopram 10 MG tablet Commonly known as:  LEXAPRO Take 10 mg by mouth daily.   HYDROcodone-acetaminophen 5-325 MG tablet Commonly known as:  NORCO/VICODIN Take 1 tablet by mouth every 6 (six) hours as needed for up to 3 days for moderate pain or severe pain.   metoprolol succinate 25 MG 24 hr tablet Commonly known as:  TOPROL-XL Take 12.5 mg by mouth 2 (two) times daily.            Durable Medical Equipment  (From admission, onward)         Start     Ordered   10/06/18 0725  For home use only DME Walker rolling  Once    Question:  Patient needs a walker to treat with the following condition  Answer:  Closed left hip fracture ([place])   10/06/18 0724          Diagnostic Studies: Dg Chest 1 View  Result Date: 10/03/2018 CLINICAL DATA:  Left hip fracture EXAM: CHEST  1 VIEW COMPARISON:  None. FINDINGS: The heart size and mediastinal contours are within normal limits. Both lungs are clear. The visualized skeletal structures are unremarkable. IMPRESSION: No active disease. Electronically Signed   By: Nelson Chimes M.D.   On: 10/03/2018 16:11   Mm 3d Screen Breast Bilateral  Result Date: 09/26/2018 CLINICAL DATA:  Screening. EXAM: DIGITAL SCREENING BILATERAL MAMMOGRAM WITH TOMO AND CAD COMPARISON:  Previous exam(s). ACR Breast Density Category c: The breast tissue is heterogeneously dense, which may obscure small masses. FINDINGS: There are no findings suspicious for malignancy. Images were processed with CAD. IMPRESSION: No  mammographic evidence of malignancy. A result letter of this screening mammogram will be mailed directly to the patient. RECOMMENDATION: Screening mammogram in one year. (Code:[id number]-B-01Y) BI-RADS CATEGORY  1: Negative. Electronically Signed   By: Fidela Salisbury M.D.   On: 09/26/2018 17:14   Dg Hip Operative Unilat W Or W/o Pelvis Left  Result Date: 10/04/2018 CLINICAL DATA:  Fracture, surgery EXAM: OPERATIVE LEFT HIP (WITH PELVIS IF PERFORMED) 4 VIEWS TECHNIQUE: Fluoroscopic spot image(s) were submitted for interpretation post-operatively. COMPARISON:  Pelvic radiograph 10/03/2018 FLUOROSCOPY TIME:  0 minutes 43 seconds Dose: 7.77 mGy FINDINGS: Osseous demineralization. Minimally displaced subcapital fracture of the LEFT femoral neck. 4 cannulated screws were placed across the observed subcapital fracture of the  LEFT femoral neck. No dislocation. IMPRESSION: Post pinning of the previously identified subcapital fracture of the LEFT femoral neck. Electronically Signed   By: Lavonia Dana M.D.   On: 10/04/2018 16:42   Dg Hip Unilat W Or Wo Pelvis 2-3 Views Left  Result Date: 10/03/2018 CLINICAL DATA:  Presents vis ems s/p fall States she was outside and her dog pulled at the leash She fell forward Landed on left hip area, history of osteoporosis, hypertension EXAM: DG HIP (WITH OR WITHOUT PELVIS) 2-3V LEFT COMPARISON:  None. FINDINGS: There is an acute subcapital femoral neck fracture of the LEFT hip, associated with impaction. No evidence for dislocation. The remainder of the pelvis is negative. IMPRESSION: Acute subcapital LEFT femoral neck fracture. Electronically Signed   By: Nolon Nations M.D.   On: 10/03/2018 15:13    Disposition: Discharge disposition: 01-Home or Self Care       Discharge Instructions    Diet - low sodium heart healthy   Complete by:  As directed    Increase activity slowly   Complete by:  As directed       Follow-up Information    Rusty Aus, MD Follow up in 6  day(s).   Specialty:  Internal Medicine Contact information: [address] [address] [phone]        Charlotte Knows, MD Follow up in 2 week(s).   Specialty:  Orthopedic Surgery Contact information: [place] [address] [phone]            Signed: Watt Climes 10/06/2018, 7:58 AM

## 2018-10-07 ENCOUNTER — Encounter: Payer: Self-pay | Admitting: Orthopedic Surgery

## 2018-10-09 ENCOUNTER — Encounter: Payer: Self-pay | Admitting: Orthopedic Surgery

## 2018-10-10 NOTE — Care Management (Addendum)
Post discharge note entry: This RNCM received a call from Dr. Rudene Christians requesting assistance with home health. He states that patient has called his office asking about home health PT (she discharged Sunday). She was originally going to SNF but ended up going being more appropriate for home health.  Per RNCM note, Helene Kelp with [facility] accepted patient. Apparently [facility] is not in network with [facility] and didn't take patient after all. I do not see any updated notes of this change.  I have made referral to [facility]- pending acceptance. RNCM will follow. I spoke with patient by phone and told her I was working on getting PT with [facility] to see her today if possible.  She will wait for my callback. Update: [facility] could not see patient until Saturday.  [facility] has accepted patient for tomorrow.  Patient has been updated with [facility] contact information.

## 2019-06-24 ENCOUNTER — Other Ambulatory Visit: Payer: Self-pay | Admitting: Obstetrics and Gynecology

## 2019-06-24 DIAGNOSIS — Z1231 Encounter for screening mammogram for malignant neoplasm of breast: Secondary | ICD-10-CM

## 2019-09-29 ENCOUNTER — Ambulatory Visit
Admission: RE | Admit: 2019-09-29 | Discharge: 2019-09-29 | Disposition: A | Discharge status: Home / Self Care | Payer: Medicare HMO | Source: Ambulatory Visit | Attending: Obstetrics and Gynecology | Admitting: Obstetrics and Gynecology

## 2019-09-29 DIAGNOSIS — Z1231 Encounter for screening mammogram for malignant neoplasm of breast: Secondary | ICD-10-CM | POA: Insufficient documentation

## 2020-01-26 ENCOUNTER — Encounter: Payer: Self-pay | Admitting: Ophthalmology

## 2020-01-26 ENCOUNTER — Other Ambulatory Visit
Admission: RE | Admit: 2020-01-26 | Discharge: 2020-01-26 | Disposition: A | Discharge status: Home / Self Care | Payer: Medicare HMO | Source: Ambulatory Visit | Attending: Ophthalmology | Admitting: Ophthalmology

## 2020-01-26 ENCOUNTER — Other Ambulatory Visit: Payer: Self-pay

## 2020-01-26 DIAGNOSIS — Z01812 Encounter for preprocedural laboratory examination: Secondary | ICD-10-CM | POA: Diagnosis present

## 2020-01-26 DIAGNOSIS — Z20822 Contact with and (suspected) exposure to covid-19: Secondary | ICD-10-CM | POA: Diagnosis not present

## 2020-01-26 NOTE — Anesthesia Preprocedure Evaluation (Addendum)
Anesthesia Evaluation  Patient identified by MRN, date of birth, ID band Patient awake    Reviewed: Allergy & Precautions, NPO status , Patient's Chart, lab work & pertinent test results  History of Anesthesia Complications Negative for: history of anesthetic complications  Airway Mallampati: III  TM Distance: >3 FB Neck ROM: Limited    Dental   Pulmonary    breath sounds clear to auscultation       Cardiovascular hypertension, (-) angina(-) DOE + dysrhythmias (Frequent PVCs)  Rhythm:Regular Rate:Normal   HLD   Neuro/Psych PSYCHIATRIC DISORDERS Anxiety Depression    GI/Hepatic neg GERD  ,  Endo/Other    Renal/GU      Musculoskeletal   Abdominal   Peds  Hematology   Anesthesia Other Findings   Reproductive/Obstetrics                            Anesthesia Physical Anesthesia Plan  ASA: II  Anesthesia Plan: MAC   Post-op Pain Management:    Induction: Intravenous  PONV Risk Score and Plan: 2 and TIVA, Midazolam and Treatment may vary due to age or medical condition  Airway Management Planned: Nasal Cannula  Additional Equipment:   Intra-op Plan:   Post-operative Plan:   Informed Consent: I have reviewed the patients History and Physical, chart, labs and discussed the procedure including the risks, benefits and alternatives for the proposed anesthesia with the patient or authorized representative who has indicated his/her understanding and acceptance.       Plan Discussed with: CRNA and Anesthesiologist  Anesthesia Plan Comments:         Anesthesia Quick Evaluation

## 2020-01-27 LAB — SARS CORONAVIRUS 2 (TAT 6-24 HRS): SARS Coronavirus 2: NEGATIVE

## 2020-01-27 NOTE — Discharge Instructions (Signed)

## 2020-01-28 ENCOUNTER — Ambulatory Visit
Admission: RE | Admit: 2020-01-28 | Discharge: 2020-01-28 | Disposition: A | Discharge status: Home / Self Care | Payer: Medicare HMO | Attending: Ophthalmology | Admitting: Ophthalmology

## 2020-01-28 ENCOUNTER — Other Ambulatory Visit: Payer: Self-pay

## 2020-01-28 ENCOUNTER — Encounter: Payer: Self-pay | Admitting: Ophthalmology

## 2020-01-28 ENCOUNTER — Encounter
Admission: RE | Disposition: A | Discharge status: Home / Self Care | Payer: Self-pay | Source: Home / Self Care | Attending: Ophthalmology

## 2020-01-28 ENCOUNTER — Ambulatory Visit: Payer: Medicare HMO | Admitting: Anesthesiology

## 2020-01-28 DIAGNOSIS — M810 Age-related osteoporosis without current pathological fracture: Secondary | ICD-10-CM | POA: Diagnosis not present

## 2020-01-28 DIAGNOSIS — F329 Major depressive disorder, single episode, unspecified: Secondary | ICD-10-CM | POA: Diagnosis not present

## 2020-01-28 DIAGNOSIS — I10 Essential (primary) hypertension: Secondary | ICD-10-CM | POA: Insufficient documentation

## 2020-01-28 DIAGNOSIS — H2511 Age-related nuclear cataract, right eye: Secondary | ICD-10-CM | POA: Diagnosis not present

## 2020-01-28 DIAGNOSIS — F419 Anxiety disorder, unspecified: Secondary | ICD-10-CM | POA: Diagnosis not present

## 2020-01-28 DIAGNOSIS — I493 Ventricular premature depolarization: Secondary | ICD-10-CM | POA: Diagnosis not present

## 2020-01-28 DIAGNOSIS — R002 Palpitations: Secondary | ICD-10-CM | POA: Insufficient documentation

## 2020-01-28 HISTORY — PX: CATARACT EXTRACTION W/PHACO: SHX586

## 2020-01-28 SURGERY — PHACOEMULSIFICATION, CATARACT, WITH IOL INSERTION
Anesthesia: Monitor Anesthesia Care | Site: Eye | Laterality: Right

## 2020-01-28 MED ORDER — ONDANSETRON HCL 4 MG/2ML IJ SOLN: 4.0000 mg | Freq: Once | INTRAMUSCULAR | Status: DC | PRN

## 2020-01-28 MED ORDER — MIDAZOLAM HCL 2 MG/2ML IJ SOLN
INTRAMUSCULAR | Status: DC | PRN
  Administered 2020-01-28: 1 mg via INTRAVENOUS

## 2020-01-28 MED ORDER — EPINEPHRINE PF 1 MG/ML IJ SOLN
INTRAOCULAR | Status: DC | PRN
  Administered 2020-01-28: 51 mL via OPHTHALMIC

## 2020-01-28 MED ORDER — NA HYALUR & NA CHOND-NA HYALUR 0.4-0.35 ML IO KIT
PACK | INTRAOCULAR | Status: DC | PRN
  Administered 2020-01-28: 1 mL via INTRAOCULAR

## 2020-01-28 MED ORDER — BRIMONIDINE TARTRATE-TIMOLOL 0.2-0.5 % OP SOLN
OPHTHALMIC | Status: DC | PRN
  Administered 2020-01-28: 1 [drp] via OPHTHALMIC

## 2020-01-28 MED ORDER — LACTATED RINGERS IV SOLN: 100.0000 mL/h | INTRAVENOUS | Status: DC

## 2020-01-28 MED ORDER — FENTANYL CITRATE (PF) 100 MCG/2ML IJ SOLN
INTRAMUSCULAR | Status: DC | PRN
  Administered 2020-01-28: 50 ug via INTRAVENOUS

## 2020-01-28 MED ORDER — LIDOCAINE HCL (PF) 2 % IJ SOLN: INTRAOCULAR | Status: DC | PRN

## 2020-01-28 MED ORDER — ACETAMINOPHEN 10 MG/ML IV SOLN: 1000.0000 mg | Freq: Once | INTRAVENOUS | Status: DC | PRN

## 2020-01-28 MED ORDER — ARMC OPHTHALMIC DILATING DROPS
1.0000 "application " | OPHTHALMIC | Status: DC | PRN
  Administered 2020-01-28 (×3): 1 via OPHTHALMIC

## 2020-01-28 MED ORDER — TETRACAINE HCL 0.5 % OP SOLN
1.0000 [drp] | OPHTHALMIC | Status: DC | PRN
  Administered 2020-01-28 (×3): 1 [drp] via OPHTHALMIC

## 2020-01-28 MED ORDER — CEFUROXIME OPHTHALMIC INJECTION 1 MG/0.1 ML
INJECTION | OPHTHALMIC | Status: DC | PRN
  Administered 2020-01-28: 0.1 mL via INTRACAMERAL

## 2020-01-28 MED ORDER — MOXIFLOXACIN HCL 0.5 % OP SOLN
1.0000 [drp] | OPHTHALMIC | Status: DC | PRN
  Administered 2020-01-28 (×3): 1 [drp] via OPHTHALMIC

## 2020-01-28 SURGICAL SUPPLY — 29 items
CANNULA ANT/CHMB 27G (MISCELLANEOUS) ×1 IMPLANT
CANNULA ANT/CHMB 27GA (MISCELLANEOUS) ×3 IMPLANT
GLOVE SURG LX 7.5 STRW (GLOVE) ×4
GLOVE SURG LX STRL 7.5 STRW (GLOVE) ×1 IMPLANT
GLOVE SURG TRIUMPH 8.0 PF LTX (GLOVE) ×3 IMPLANT
GOWN STRL REUS W/ TWL LRG LVL3 (GOWN DISPOSABLE) ×2 IMPLANT
GOWN STRL REUS W/TWL LRG LVL3 (GOWN DISPOSABLE) ×6
LENS IOL DIOP 20.5 (Intraocular Lens) ×3 IMPLANT
LENS IOL TECNIS MONO 20.5 (Intraocular Lens) IMPLANT
MARKER SKIN DUAL TIP RULER LAB (MISCELLANEOUS) ×3 IMPLANT
NDL CAPSULORHEX 25GA (NEEDLE) ×1 IMPLANT
NDL FILTER BLUNT 18X1 1/2 (NEEDLE) ×2 IMPLANT
NDL RETROBULBAR .5 NSTRL (NEEDLE) IMPLANT
NEEDLE CAPSULORHEX 25GA (NEEDLE) ×3 IMPLANT
NEEDLE FILTER BLUNT 18X 1/2SAF (NEEDLE) ×4
NEEDLE FILTER BLUNT 18X1 1/2 (NEEDLE) ×2 IMPLANT
PACK CATARACT BRASINGTON (MISCELLANEOUS) ×3 IMPLANT
PACK EYE AFTER SURG (MISCELLANEOUS) ×3 IMPLANT
PACK OPTHALMIC (MISCELLANEOUS) ×3 IMPLANT
RING MALYGIN 7.0 (MISCELLANEOUS) IMPLANT
SOLUTION OPHTHALMIC SALT (MISCELLANEOUS) ×3 IMPLANT
SUT ETHILON 10-0 CS-B-6CS-B-6 (SUTURE)
SUT VICRYL  9 0 (SUTURE)
SUT VICRYL 9 0 (SUTURE) IMPLANT
SUTURE EHLN 10-0 CS-B-6CS-B-6 (SUTURE) IMPLANT
SYR 3ML LL SCALE MARK (SYRINGE) ×6 IMPLANT
SYR TB 1ML LUER SLIP (SYRINGE) ×3 IMPLANT
WATER STERILE IRR 250ML POUR (IV SOLUTION) ×3 IMPLANT
WIPE NON LINTING 3.25X3.25 (MISCELLANEOUS) ×3 IMPLANT

## 2020-01-28 NOTE — Anesthesia Procedure Notes (Signed)
Procedure Name: MAC Performed by: Caylor Cerino, CRNA Pre-anesthesia Checklist: Patient identified, Emergency Drugs available, Suction available, Timeout performed and Patient being monitored Patient Re-evaluated:Patient Re-evaluated prior to induction Oxygen Delivery Method: Nasal cannula Placement Confirmation: positive ETCO2       

## 2020-01-28 NOTE — Op Note (Signed)
LOCATION:  [place]   PREOPERATIVE DIAGNOSIS:    Nuclear sclerotic cataract right eye. H25.11   POSTOPERATIVE DIAGNOSIS:  Nuclear sclerotic cataract right eye.     PROCEDURE:  Phacoemusification with posterior chamber intraocular lens placement of the right eye   ULTRASOUND TIME: Procedure(s) with comments: CATARACT EXTRACTION PHACO AND INTRAOCULAR LENS PLACEMENT (IOC) RIGHT (Right) - 10.43 0:52.0 20.0%  LENS:   Implant Name Type Inv. Item Serial No. Manufacturer Lot No. LRB No. Used Action  LENS IOL DIOP 20.5 - [id number] Intraocular Lens LENS IOL DIOP 20.5 [id number]  Right 1 Implanted         SURGEON:  Wyonia Hough, MD   ANESTHESIA:  Topical with tetracaine drops and 2% Xylocaine jelly, augmented with 1% preservative-free intracameral lidocaine.    COMPLICATIONS:  None.   DESCRIPTION OF PROCEDURE:  The patient was identified in the holding room and transported to the operating room and placed in the supine position under the operating microscope.  The right eye was identified as the operative eye and it was prepped and draped in the usual sterile ophthalmic fashion.   A 1 millimeter clear-corneal paracentesis was made at the 12:00 position.  0.5 ml of preservative-free 1% lidocaine was injected into the anterior chamber. The anterior chamber was filled with Viscoat viscoelastic.  A 2.4 millimeter keratome was used to make a near-clear corneal incision at the 9:00 position.  A curvilinear capsulorrhexis was made with a cystotome and capsulorrhexis forceps.  Balanced salt solution was used to hydrodissect and hydrodelineate the nucleus.   Phacoemulsification was then used in stop and chop fashion to remove the lens nucleus and epinucleus.  The remaining cortex was then removed using the irrigation and aspiration handpiece. Provisc was then placed into the capsular bag to distend it for lens placement.  A lens was then injected into the capsular bag.  The  remaining viscoelastic was aspirated.   Wounds were hydrated with balanced salt solution.  The anterior chamber was inflated to a physiologic pressure with balanced salt solution.  No wound leaks were noted. Cefuroxime 0.1 ml of a 10mg /ml solution was injected into the anterior chamber for a dose of 1 mg of intracameral antibiotic at the completion of the case.   Timolol and Brimonidine drops were applied to the eye.  The patient was taken to the recovery room in stable condition without complications of anesthesia or surgery.   Zohaib Heeney 01/28/2020, 9:11 AM

## 2020-01-28 NOTE — Anesthesia Postprocedure Evaluation (Signed)
Anesthesia Post Note  Patient: Charlotte Webb  Procedure(s) Performed: CATARACT EXTRACTION PHACO AND INTRAOCULAR LENS PLACEMENT (IOC) RIGHT (Right Eye)     Patient location during evaluation: PACU Anesthesia Type: MAC Level of consciousness: awake and alert Pain management: pain level controlled Vital Signs Assessment: post-procedure vital signs reviewed and stable Respiratory status: spontaneous breathing, nonlabored ventilation, respiratory function stable and patient connected to nasal cannula oxygen Cardiovascular status: stable and blood pressure returned to baseline Postop Assessment: no apparent nausea or vomiting Anesthetic complications: no    Torrie Namba A  Adah Stoneberg

## 2020-01-28 NOTE — H&P (Signed)

## 2020-01-28 NOTE — Transfer of Care (Signed)
Immediate Anesthesia Transfer of Care Note  Patient: Charlotte Webb  Procedure(s) Performed: CATARACT EXTRACTION PHACO AND INTRAOCULAR LENS PLACEMENT (IOC) RIGHT (Right Eye)  Patient Location: PACU  Anesthesia Type: MAC  Level of Consciousness: awake, alert  and patient cooperative  Airway and Oxygen Therapy: Patient Spontanous Breathing and Patient connected to supplemental oxygen  Post-op Assessment: Post-op Vital signs reviewed, Patient's Cardiovascular Status Stable, Respiratory Function Stable, Patent Airway and No signs of Nausea or vomiting  Post-op Vital Signs: Reviewed and stable  Complications: No apparent anesthesia complications

## 2020-01-29 ENCOUNTER — Encounter: Payer: Self-pay | Admitting: *Deleted

## 2020-08-24 ENCOUNTER — Other Ambulatory Visit: Payer: Self-pay | Admitting: Internal Medicine

## 2020-08-24 DIAGNOSIS — Z1231 Encounter for screening mammogram for malignant neoplasm of breast: Secondary | ICD-10-CM

## 2020-11-15 ENCOUNTER — Other Ambulatory Visit: Payer: Self-pay

## 2020-11-15 ENCOUNTER — Ambulatory Visit
Admission: RE | Admit: 2020-11-15 | Discharge: 2020-11-15 | Disposition: A | Discharge status: Home / Self Care | Payer: Medicare HMO | Source: Ambulatory Visit | Attending: Internal Medicine | Admitting: Internal Medicine

## 2020-11-15 DIAGNOSIS — Z1231 Encounter for screening mammogram for malignant neoplasm of breast: Secondary | ICD-10-CM | POA: Diagnosis not present

## 2021-01-10 ENCOUNTER — Other Ambulatory Visit: Payer: Self-pay | Admitting: Physician Assistant

## 2021-01-10 ENCOUNTER — Other Ambulatory Visit: Payer: Self-pay

## 2021-01-10 ENCOUNTER — Ambulatory Visit
Admission: RE | Admit: 2021-01-10 | Discharge: 2021-01-10 | Disposition: A | Discharge status: Home / Self Care | Payer: Medicare HMO | Source: Home / Self Care | Attending: Physician Assistant | Admitting: Physician Assistant

## 2021-01-10 ENCOUNTER — Ambulatory Visit
Admission: RE | Admit: 2021-01-10 | Discharge: 2021-01-10 | Disposition: A | Discharge status: Home / Self Care | Payer: Medicare HMO | Source: Ambulatory Visit | Attending: Physician Assistant | Admitting: Physician Assistant

## 2021-01-10 DIAGNOSIS — M79604 Pain in right leg: Secondary | ICD-10-CM | POA: Diagnosis not present

## 2021-10-12 ENCOUNTER — Other Ambulatory Visit: Payer: Self-pay | Admitting: Obstetrics and Gynecology

## 2021-10-12 DIAGNOSIS — Z1231 Encounter for screening mammogram for malignant neoplasm of breast: Secondary | ICD-10-CM

## 2021-11-16 ENCOUNTER — Ambulatory Visit
Admission: RE | Admit: 2021-11-16 | Discharge: 2021-11-16 | Disposition: A | Discharge status: Home / Self Care | Payer: Medicare HMO | Source: Ambulatory Visit | Attending: Obstetrics and Gynecology | Admitting: Obstetrics and Gynecology

## 2021-11-16 ENCOUNTER — Other Ambulatory Visit: Payer: Self-pay

## 2021-11-16 DIAGNOSIS — Z1231 Encounter for screening mammogram for malignant neoplasm of breast: Secondary | ICD-10-CM | POA: Diagnosis present

## 2022-10-10 ENCOUNTER — Other Ambulatory Visit: Payer: Self-pay | Admitting: Internal Medicine

## 2022-10-10 DIAGNOSIS — Z1231 Encounter for screening mammogram for malignant neoplasm of breast: Secondary | ICD-10-CM

## 2022-11-22 ENCOUNTER — Ambulatory Visit
Admission: RE | Admit: 2022-11-22 | Discharge: 2022-11-22 | Disposition: A | Discharge status: Home / Self Care | Payer: Medicare HMO | Source: Ambulatory Visit | Attending: Internal Medicine | Admitting: Internal Medicine

## 2022-11-22 DIAGNOSIS — Z1231 Encounter for screening mammogram for malignant neoplasm of breast: Secondary | ICD-10-CM | POA: Diagnosis present

## 2022-12-05 ENCOUNTER — Encounter: Payer: Self-pay | Admitting: Ophthalmology

## 2022-12-11 NOTE — Discharge Instructions (Signed)
   Cataract Surgery, Care After ? ?This sheet gives you information about how to care for yourself after your surgery.  Your ophthalmologist may also give you more specific instructions.  If you have problems or questions, contact your doctor at [facility], [phone]. ? ?What can I expect after the surgery? ?It is common to have: ?Itching ?Foreign body sensation (feels like a grain of sand in the eye) ?Watery discharge (excess tearing) ?Sensitivity to light and touch ?Bruising in or around the eye ?Mild blurred vision ? ?Follow these instructions at home: ?Do not touch or rub your eyes. ?You may be told to wear a protective shield or sunglasses to protect your eyes. ?Do not put a contact lens in the operative eye unless your doctor approves. ?Keep the lids and face clean and dry. ?Do not allow water to hit you directly in the face while showering. ?Keep soap and shampoo out of your eyes. ?Do not use eye makeup for 1 week. ? ?Check your eye every day for signs of infection.  Watch for: ?Redness, swelling, or pain. ?Fluid, blood or pus. ?Worsening vision. ?Worsening sensitivity to light or touch. ? ?Activity: ?During the first day, avoid bending over and reading.  You may resume reading and bending the next day. ?Do not drive or use heavy machinery for at least 24 hours. ?Avoid strenuous activities for 1 week.  Activities such as walking, treadmill, exercise bike, and climbing stairs are okay. ?Do not lift heavy (>20 pound) objects for 1 week. ?Do not do yardwork, gardening, or dirty housework (mopping, cleaning bathrooms, vacuuming, etc.) for 1 week. ?Do not swim or use a hot tub for 2 weeks. ?Ask your doctor when you can return to work. ? ?General Instructions: ?Take or apply prescription and over-the-counter medicines as directed by your doctor, including eyedrops and ointments. ?Resume medications discontinued prior to surgery, unless told otherwise by your doctor. ?Keep all follow up appointments as  scheduled. ? ?Contact a health care provider if: ?You have increased bruising around your eye. ?You have pain that is not helped with medication. ?You have a fever. ?You have fluid, pus, or blood coming from your eye or incision. ?Your sensitivity to light gets worse. ?You have spots (floaters) of flashing lights in your vision. ?You have nausea or vomiting. ? ?Go to the nearest emergency room or call 911 if: ?You have sudden loss of vision. ?You have severe, worsening eye pain. ? ?

## 2022-12-13 ENCOUNTER — Ambulatory Visit: Payer: Medicare HMO | Admitting: Anesthesiology

## 2022-12-13 ENCOUNTER — Encounter: Payer: Self-pay | Admitting: Ophthalmology

## 2022-12-13 ENCOUNTER — Other Ambulatory Visit: Payer: Self-pay

## 2022-12-13 ENCOUNTER — Encounter
Admission: RE | Disposition: A | Discharge status: Home / Self Care | Payer: Self-pay | Source: Home / Self Care | Attending: Ophthalmology

## 2022-12-13 ENCOUNTER — Ambulatory Visit
Admission: RE | Admit: 2022-12-13 | Discharge: 2022-12-13 | Disposition: A | Discharge status: Home / Self Care | Payer: Medicare HMO | Attending: Ophthalmology | Admitting: Ophthalmology

## 2022-12-13 DIAGNOSIS — I10 Essential (primary) hypertension: Secondary | ICD-10-CM | POA: Diagnosis not present

## 2022-12-13 DIAGNOSIS — H2512 Age-related nuclear cataract, left eye: Secondary | ICD-10-CM | POA: Diagnosis present

## 2022-12-13 HISTORY — PX: CATARACT EXTRACTION W/PHACO: SHX586

## 2022-12-13 HISTORY — DX: Palpitations: R00.2

## 2022-12-13 SURGERY — PHACOEMULSIFICATION, CATARACT, WITH IOL INSERTION
Anesthesia: Monitor Anesthesia Care | Site: Eye | Laterality: Left

## 2022-12-13 MED ORDER — TETRACAINE HCL 0.5 % OP SOLN
1.0000 [drp] | OPHTHALMIC | Status: DC | PRN
  Administered 2022-12-13 (×3): 1 [drp] via OPHTHALMIC

## 2022-12-13 MED ORDER — MIDAZOLAM HCL 2 MG/2ML IJ SOLN
INTRAMUSCULAR | Status: DC | PRN
  Administered 2022-12-13: 1 mg via INTRAVENOUS

## 2022-12-13 MED ORDER — SIGHTPATH DOSE#1 NA HYALUR & NA CHOND-NA HYALUR IO KIT
PACK | INTRAOCULAR | Status: DC | PRN
  Administered 2022-12-13: 1 via OPHTHALMIC

## 2022-12-13 MED ORDER — SIGHTPATH DOSE#1 BSS IO SOLN
INTRAOCULAR | Status: DC | PRN
  Administered 2022-12-13: 1 mL

## 2022-12-13 MED ORDER — CEFUROXIME OPHTHALMIC INJECTION 1 MG/0.1 ML
INJECTION | OPHTHALMIC | Status: DC | PRN
  Administered 2022-12-13: .1 mL via INTRACAMERAL

## 2022-12-13 MED ORDER — BRIMONIDINE TARTRATE-TIMOLOL 0.2-0.5 % OP SOLN
OPHTHALMIC | Status: DC | PRN
  Administered 2022-12-13: 1 [drp] via OPHTHALMIC

## 2022-12-13 MED ORDER — FENTANYL CITRATE (PF) 100 MCG/2ML IJ SOLN
INTRAMUSCULAR | Status: DC | PRN
  Administered 2022-12-13: 50 ug via INTRAVENOUS

## 2022-12-13 MED ORDER — SIGHTPATH DOSE#1 BSS IO SOLN
INTRAOCULAR | Status: DC | PRN
  Administered 2022-12-13: 69 mL via OPHTHALMIC

## 2022-12-13 MED ORDER — LACTATED RINGERS IV SOLN: INTRAVENOUS | Status: DC

## 2022-12-13 MED ORDER — ARMC OPHTHALMIC DILATING DROPS
1.0000 | OPHTHALMIC | Status: DC | PRN
  Administered 2022-12-13 (×3): 1 via OPHTHALMIC

## 2022-12-13 MED ORDER — SIGHTPATH DOSE#1 BSS IO SOLN
INTRAOCULAR | Status: DC | PRN
  Administered 2022-12-13: 15 mL

## 2022-12-13 SURGICAL SUPPLY — 20 items
CANNULA ANT/CHMB 27G (MISCELLANEOUS) IMPLANT
CANNULA ANT/CHMB 27GA (MISCELLANEOUS) IMPLANT
CATARACT SUITE SIGHTPATH (MISCELLANEOUS) ×1 IMPLANT
FEE CATARACT SUITE SIGHTPATH (MISCELLANEOUS) ×1 IMPLANT
GLOVE SRG 8 PF TXTR STRL LF DI (GLOVE) ×1 IMPLANT
GLOVE SURG ENC TEXT LTX SZ7.5 (GLOVE) ×1 IMPLANT
GLOVE SURG GAMMEX PI TX LF 7.5 (GLOVE) IMPLANT
GLOVE SURG UNDER POLY LF SZ8 (GLOVE) ×1
LENS IOL TECNIS EYHANCE 21.0 (Intraocular Lens) IMPLANT
NDL FILTER BLUNT 18X1 1/2 (NEEDLE) ×1 IMPLANT
NDL RETROBULBAR .5 NSTRL (NEEDLE) IMPLANT
NEEDLE FILTER BLUNT 18X1 1/2 (NEEDLE) ×1 IMPLANT
PACK VIT ANT 23G (MISCELLANEOUS) IMPLANT
RING MALYGIN 7.0 (MISCELLANEOUS) IMPLANT
SUT ETHILON 10-0 CS-B-6CS-B-6 (SUTURE)
SUT VICRYL  9 0 (SUTURE)
SUT VICRYL 9 0 (SUTURE) IMPLANT
SUTURE EHLN 10-0 CS-B-6CS-B-6 (SUTURE) IMPLANT
SYR 3ML LL SCALE MARK (SYRINGE) ×1 IMPLANT
WATER STERILE IRR 250ML POUR (IV SOLUTION) ×1 IMPLANT

## 2022-12-13 NOTE — Transfer of Care (Signed)
Immediate Anesthesia Transfer of Care Note  Patient: Charlotte Webb  Procedure(s) Performed: CATARACT EXTRACTION PHACO AND INTRAOCULAR LENS PLACEMENT (IOC) LEFT (Left: Eye)  Patient Location: PACU  Anesthesia Type: MAC  Level of Consciousness: awake, alert  and patient cooperative  Airway and Oxygen Therapy: Patient Spontanous Breathing and Patient connected to supplemental oxygen  Post-op Assessment: Post-op Vital signs reviewed, Patient's Cardiovascular Status Stable, Respiratory Function Stable, Patent Airway and No signs of Nausea or vomiting  Post-op Vital Signs: Reviewed and stable  Complications: No notable events documented.

## 2022-12-13 NOTE — H&P (Signed)
[place]   Primary Care Physician:  Rusty Aus, MD Ophthalmologist: Dr. Leandrew Koyanagi  Pre-Procedure History & Physical: HPI:  Charlotte Webb is a 77 y.o. female here for ophthalmic surgery.   Past Medical History:  Diagnosis Date   Anxiety    Arrhythmia    Depression    Goiter    Hypertension    Mitral valve disorder    Osteoporosis    Palpitations     Past Surgical History:  Procedure Laterality Date   CATARACT EXTRACTION W/PHACO Right 01/28/2020   Procedure: CATARACT EXTRACTION PHACO AND INTRAOCULAR LENS PLACEMENT ([place]) RIGHT;  Surgeon: Leandrew Koyanagi, MD;  Location: [place];  Service: Ophthalmology;  Laterality: Right;  10.43 0:52.0 20.0%   COLONOSCOPY     COLONOSCOPY WITH PROPOFOL N/A 01/30/2018   Procedure: COLONOSCOPY WITH PROPOFOL;  Surgeon: Manya Silvas, MD;  [facility] END[facility];  Service: Endoscopy;  Laterality: N/A;   HIP PINNING,CANNULATED Left 10/04/2018   Procedure: CANNULATED HIP PINNING;  Surgeon: Hessie Knows, MD;  Location: [facility] ORS;  Service: Orthopedics;  Laterality: Left;   thyroid nodule removed 20 years ago     TUBAL LIGATION      Prior to Admission medications   Medication Sig Start Date End Date Taking? Authorizing Provider  acetaminophen (TYLENOL) 500 MG tablet Take 500 mg by mouth every 4 (four) hours as needed for mild pain or moderate pain.    Yes [provider]  Ascorbic Acid (VITAMIN C PO) Take by mouth daily.   Yes [provider]  aspirin EC 81 MG tablet Take 81 mg by mouth every evening.    Yes [provider]  Calcium Carb-Cholecalciferol (CALCIUM CARBONATE-VITAMIN D3 PO) Take 500 mg by mouth.   Yes [provider]  escitalopram (LEXAPRO) 10 MG tablet Take 10 mg by mouth daily.   Yes [provider]  propranolol (INDERAL) 40 MG tablet Take 40 mg by mouth 2 (two) times daily.   Yes [provider]  zinc gluconate 50 MG tablet Take 50 mg  by mouth daily.   Yes [provider]    Allergies as of 10/11/2022   (No Known Allergies)    Family History  Problem Relation Age of Onset   Brain cancer Mother    Lung cancer Maternal Uncle    Ovarian cancer Maternal Grandmother    Breast cancer Neg Hx     Social History   Socioeconomic History   Marital status: Married    Spouse name: Not on file   Number of children: Not on file   Years of education: Not on file   Highest education level: Not on file  Occupational History   Not on file  Tobacco Use   Smoking status: Never   Smokeless tobacco: Never  Vaping Use   Vaping Use: Never used  Substance and Sexual Activity   Alcohol use: No   Drug use: No   Sexual activity: Not on file  Other Topics Concern   Not on file  Social History Narrative   Not on file   Social Determinants of Health   Financial Resource Strain: Not on file  Food Insecurity: Not on file  Transportation Needs: Not on file  Physical Activity: Not on file  Stress: Not on file  Social Connections: Not on file  Intimate Partner Violence: Not on file    Review of Systems: See HPI, otherwise negative ROS  Physical Exam: Ht '5\' 6"'$  (1.676 m)  Wt 68 kg   BMI 24.21 kg/m  General:   Alert,  pleasant and cooperative in NAD Head:  Normocephalic and atraumatic. Lungs:  Clear to auscultation.    Heart:  Regular rate and rhythm.   Impression/Plan: Charlotte Webb is here for ophthalmic surgery.  Risks, benefits, limitations, and alternatives regarding ophthalmic surgery have been reviewed with the patient.  Questions have been answered.  All parties agreeable.   Leandrew Koyanagi, MD  12/13/2022, 11:32 AM

## 2022-12-13 NOTE — Op Note (Signed)
OPERATIVE NOTE  Charlotte Webb [id number] 12/13/2022   PREOPERATIVE DIAGNOSIS:  Nuclear sclerotic cataract left eye. H25.12   POSTOPERATIVE DIAGNOSIS:    Nuclear sclerotic cataract left eye.     PROCEDURE:  Phacoemusification with posterior chamber intraocular lens placement of the left eye  Ultrasound time: Procedure(s) with comments: CATARACT EXTRACTION PHACO AND INTRAOCULAR LENS PLACEMENT (IOC) LEFT (Left) - 10.00 1:01.3  LENS:  * No implants in log *    SURGEON:  Wyonia Hough, MD   ANESTHESIA:  Topical with tetracaine drops and 2% Xylocaine jelly, augmented with 1% preservative-free intracameral lidocaine.    COMPLICATIONS:  None.   DESCRIPTION OF PROCEDURE:  The patient was identified in the holding room and transported to the operating room and placed in the supine position under the operating microscope.  The left eye was identified as the operative eye and it was prepped and draped in the usual sterile ophthalmic fashion.   A 1 millimeter clear-corneal paracentesis was made at the 1:30 position.  0.5 ml of preservative-free 1% lidocaine was injected into the anterior chamber.  The anterior chamber was filled with Viscoat viscoelastic.  A 2.4 millimeter keratome was used to make a near-clear corneal incision at the 10:30 position.  .  A curvilinear capsulorrhexis was made with a cystotome and capsulorrhexis forceps.  Balanced salt solution was used to hydrodissect and hydrodelineate the nucleus.   Phacoemulsification was then used in stop and chop fashion to remove the lens nucleus and epinucleus.  The remaining cortex was then removed using the irrigation and aspiration handpiece. Provisc was then placed into the capsular bag to distend it for lens placement.  A lens was then injected into the capsular bag.  The remaining viscoelastic was aspirated.   Wounds were hydrated with balanced salt solution.  The anterior chamber was inflated to a physiologic pressure with  balanced salt solution.  No wound leaks were noted. Cefuroxime 0.1 ml of a '10mg'$ /ml solution was injected into the anterior chamber for a dose of 1 mg of intracameral antibiotic at the completion of the case.   Timolol and Brimonidine drops were applied to the eye.  The patient was taken to the recovery room in stable condition without complications of anesthesia or surgery.  [name] 12/13/2022, 12:36 PM

## 2022-12-13 NOTE — Anesthesia Postprocedure Evaluation (Signed)
Anesthesia Post Note  Patient: Charlotte Webb  Procedure(s) Performed: CATARACT EXTRACTION PHACO AND INTRAOCULAR LENS PLACEMENT (IOC) LEFT (Left: Eye)  Patient location during evaluation: PACU Anesthesia Type: MAC Level of consciousness: awake and alert Pain management: pain level controlled Vital Signs Assessment: post-procedure vital signs reviewed and stable Respiratory status: spontaneous breathing, nonlabored ventilation and respiratory function stable Cardiovascular status: blood pressure returned to baseline and stable Postop Assessment: no apparent nausea or vomiting Anesthetic complications: no   No notable events documented.   Last Vitals:  Vitals:   12/13/22 1239 12/13/22 1244  BP: 134/76 134/73  Pulse: (!) 59 60  Resp: 14 13  Temp: (!) 36.3 C (!) 36.3 C  SpO2: 97% 95%    Last Pain:  Vitals:   12/13/22 1244  TempSrc:   PainSc: 0-No pain                 Precious Haws Dru Primeau

## 2022-12-13 NOTE — Anesthesia Preprocedure Evaluation (Signed)
Anesthesia Evaluation  Patient identified by MRN, date of birth, ID band Patient awake    Reviewed: Allergy & Precautions, NPO status , Patient's Chart, lab work & pertinent test results  History of Anesthesia Complications Negative for: history of anesthetic complications  Airway Mallampati: III  TM Distance: >3 FB Neck ROM: full    Dental  (+) Chipped, Poor Dentition   Pulmonary neg shortness of breath   Pulmonary exam normal        Cardiovascular Exercise Tolerance: Good hypertension, (-) angina Normal cardiovascular exam     Neuro/Psych  PSYCHIATRIC DISORDERS      negative neurological ROS     GI/Hepatic negative GI ROS, Neg liver ROS,neg GERD  ,,  Endo/Other  negative endocrine ROS    Renal/GU      Musculoskeletal   Abdominal   Peds  Hematology negative hematology ROS (+)   Anesthesia Other Findings Past Medical History: No date: Anxiety No date: Arrhythmia No date: Depression No date: Goiter No date: Hypertension No date: Mitral valve disorder No date: Osteoporosis No date: Palpitations  Past Surgical History: 01/28/2020: CATARACT EXTRACTION W/PHACO; Right     Comment:  Procedure: CATARACT EXTRACTION PHACO AND INTRAOCULAR               LENS PLACEMENT ([place]) RIGHT;  Surgeon: Leandrew Koyanagi, MD;  Location: [place];  Service:               Ophthalmology;  Laterality: Right;  10.43 0:52.0 20.0% No date: COLONOSCOPY 01/30/2018: COLONOSCOPY WITH PROPOFOL; N/A     Comment:  Procedure: COLONOSCOPY WITH PROPOFOL;  Surgeon: Manya Silvas, MD;  [facility] ENDOSCOPY;  Service:               Endoscopy;  Laterality: N/A; 10/04/2018: HIP PINNING,CANNULATED; Left     Comment:  Procedure: CANNULATED HIP PINNING;  Surgeon: Hessie Knows, MD;  Location: [facility] ORS;  Service: Orthopedics;               Laterality: Left; No date: thyroid nodule removed 20  years ago No date: TUBAL LIGATION  BMI    Body Mass Index: 24.21 kg/m      Reproductive/Obstetrics negative OB ROS                             Anesthesia Physical Anesthesia Plan  ASA: 3  Anesthesia Plan: MAC   Post-op Pain Management:    Induction: Intravenous  PONV Risk Score and Plan:   Airway Management Planned: Natural Airway and Nasal Cannula  Additional Equipment:   Intra-op Plan:   Post-operative Plan:   Informed Consent: I have reviewed the patients History and Physical, chart, labs and discussed the procedure including the risks, benefits and alternatives for the proposed anesthesia with the patient or authorized representative who has indicated his/her understanding and acceptance.     Dental Advisory Given  Plan Discussed with: Anesthesiologist, CRNA and Surgeon  Anesthesia Plan Comments: (Patient consented for risks of anesthesia including but not limited to:  - adverse reactions to medications - damage to eyes, teeth, lips or other oral mucosa - nerve damage due to positioning  - sore throat or hoarseness - Damage to heart,  brain, nerves, lungs, other parts of body or loss of life  Patient voiced understanding.)       Anesthesia Quick Evaluation

## 2022-12-14 ENCOUNTER — Encounter: Payer: Self-pay | Admitting: Ophthalmology

## 2022-12-21 ENCOUNTER — Encounter: Payer: Self-pay | Admitting: Ophthalmology

## 2023-10-29 ENCOUNTER — Other Ambulatory Visit: Payer: Self-pay | Admitting: Internal Medicine

## 2023-10-29 DIAGNOSIS — Z1231 Encounter for screening mammogram for malignant neoplasm of breast: Secondary | ICD-10-CM

## 2023-11-18 IMAGING — MG MM DIGITAL SCREENING BILAT W/ TOMO AND CAD
6 of 10 series · 6 of 30 positions shown · non-contrast
Comparison: Previous exam(s).

CLINICAL DATA: Screening.

EXAM:
DIGITAL SCREENING BILATERAL MAMMOGRAM WITH TOMOSYNTHESIS AND CAD
TECHNIQUE: Bilateral screening digital craniocaudal and mediolateral oblique
mammograms were obtained. Bilateral screening digital breast
tomosynthesis was performed. The images were evaluated with
computer-aided detection.

[R MLO synth-2D (1 of 2)]
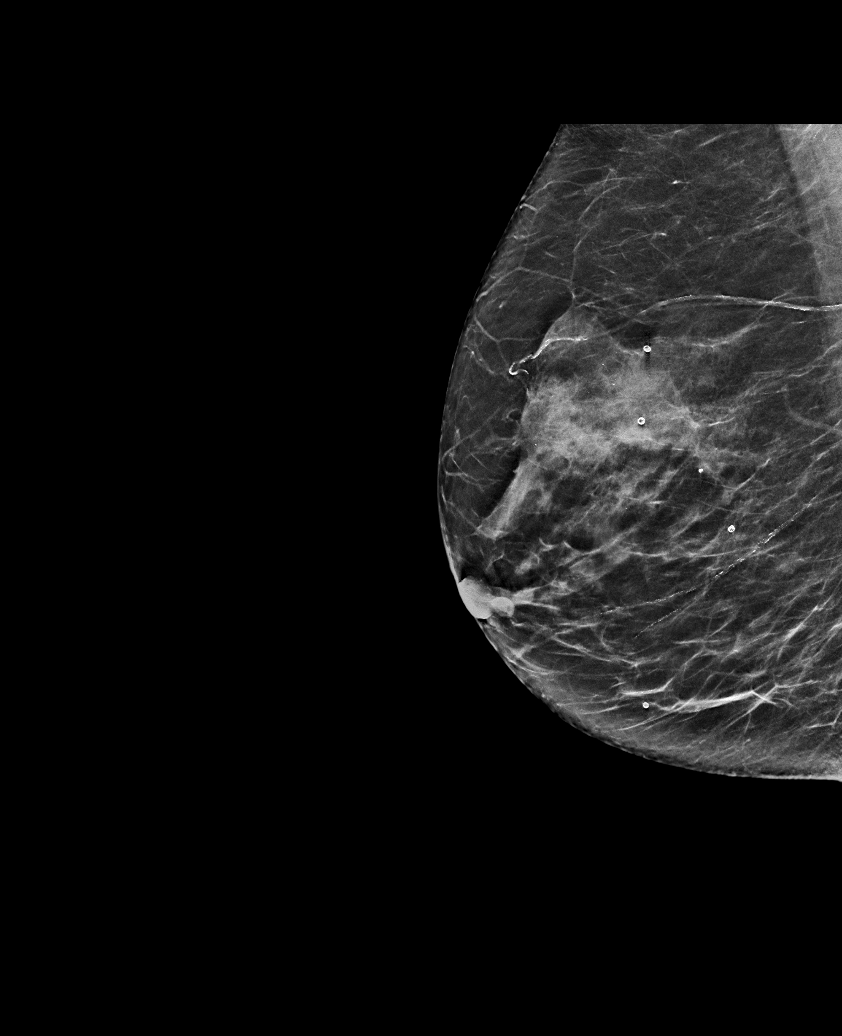

[L MLO synth-2D]
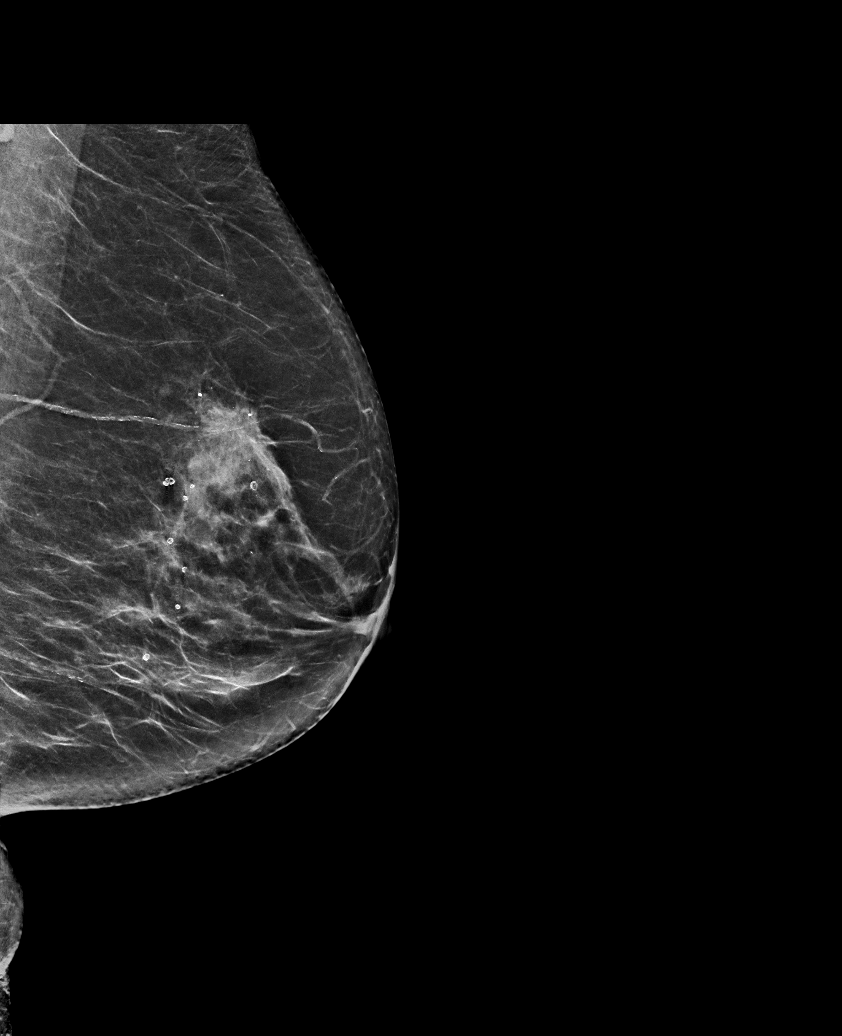

[R MLO synth-2D (2 of 2)]
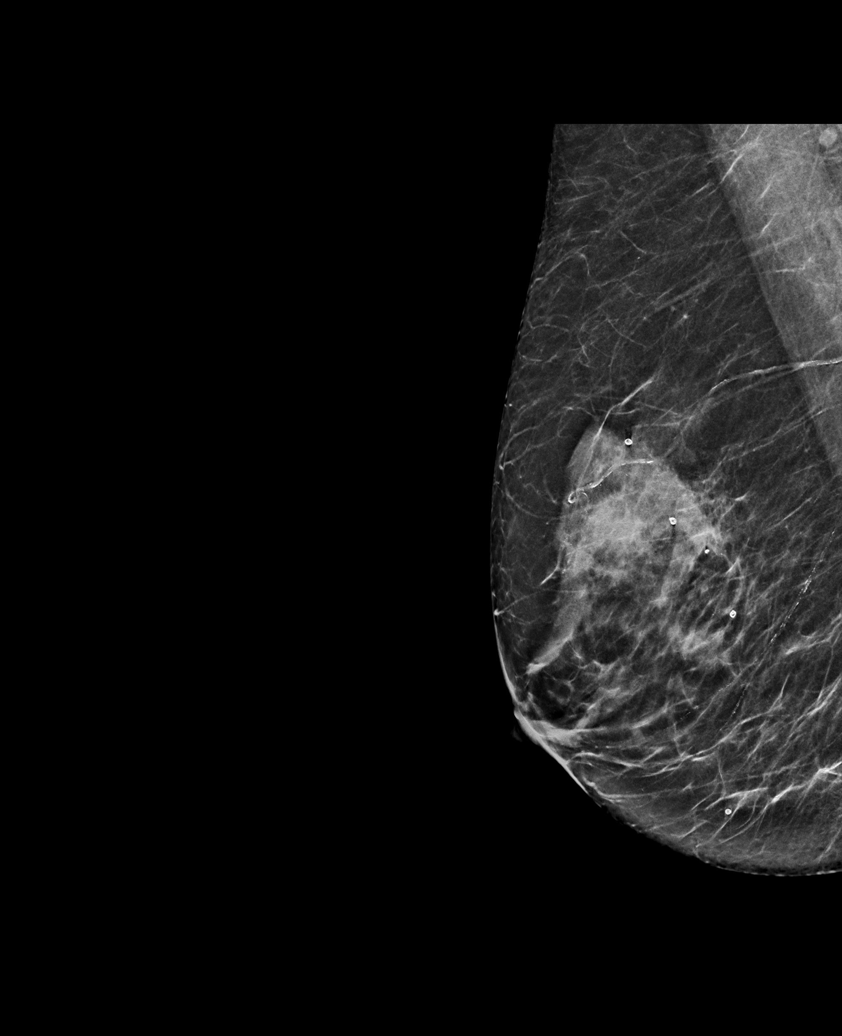

[L CC synth-2D]
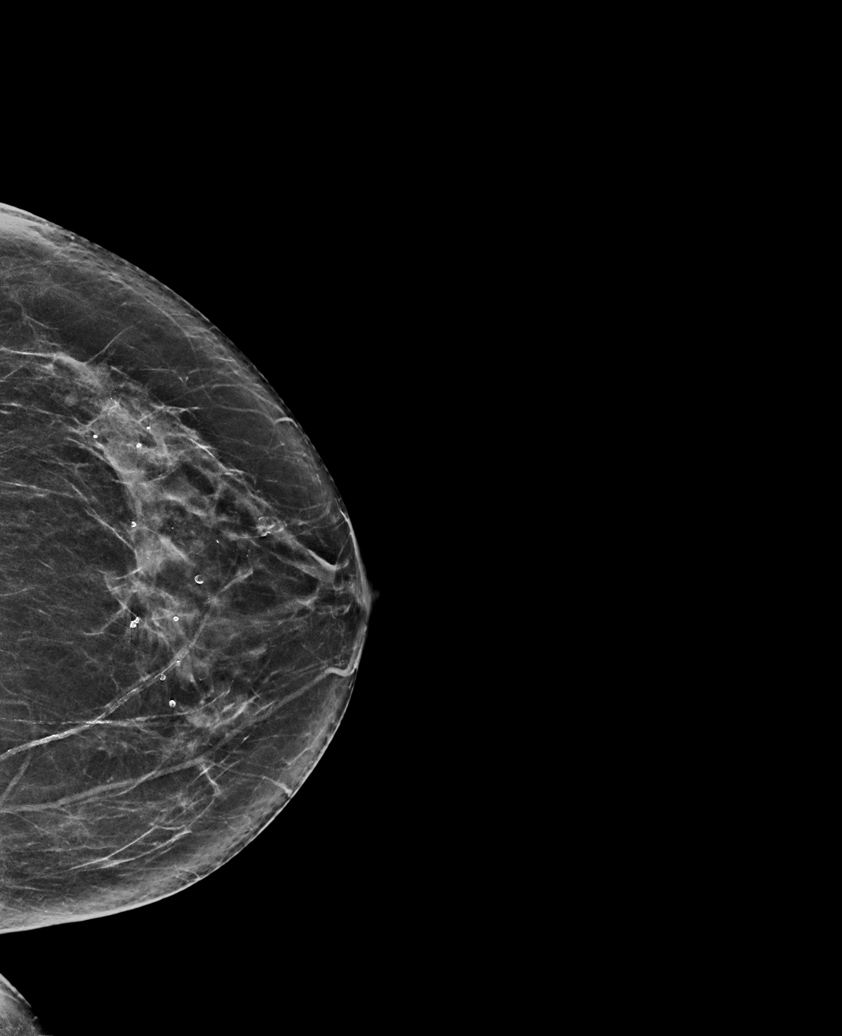

[R CC synth-2D]
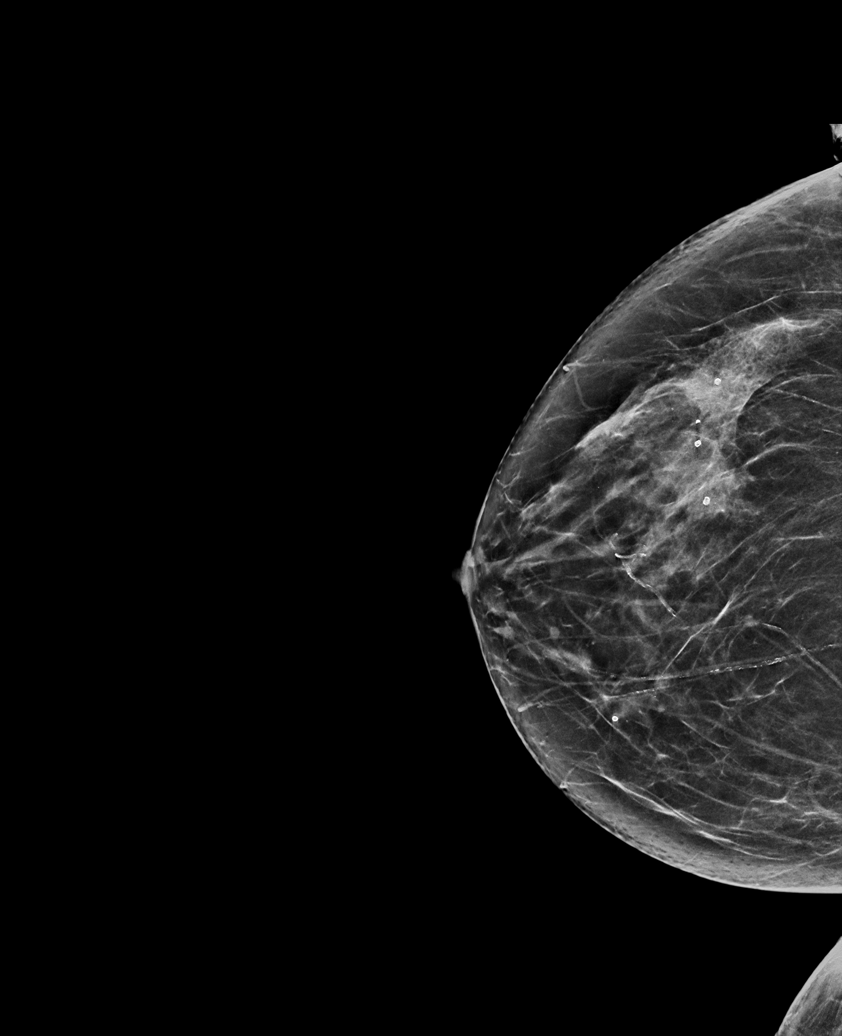

[R CC tomo · tomo slice 33/64.0]
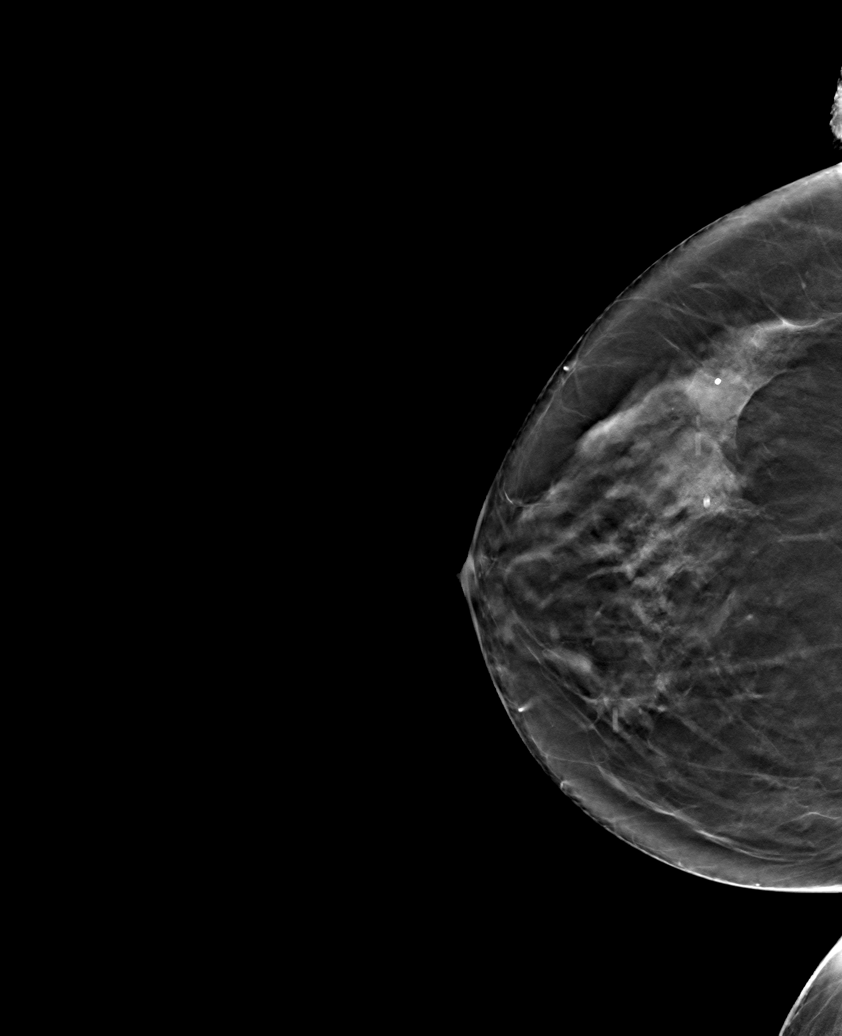

[6 of 30 positions shown; findings below may reference images not displayed]

ACR Breast Density Category c: The breast tissue is heterogeneously
dense, which may obscure small masses.
FINDINGS: There are no findings suspicious for malignancy.
IMPRESSION: No mammographic evidence of malignancy. A result letter of this
screening mammogram will be mailed directly to the patient.

RECOMMENDATION:
Screening mammogram in one year. (Code:[id number])

BI-RADS CATEGORY  1: Negative.

## 2023-11-26 ENCOUNTER — Ambulatory Visit
Admission: RE | Admit: 2023-11-26 | Discharge: 2023-11-26 | Disposition: A | Discharge status: Home / Self Care | Payer: Medicare HMO | Source: Ambulatory Visit | Attending: Internal Medicine | Admitting: Internal Medicine

## 2023-11-26 DIAGNOSIS — Z1231 Encounter for screening mammogram for malignant neoplasm of breast: Secondary | ICD-10-CM | POA: Diagnosis present

## 2024-08-07 ENCOUNTER — Encounter: Payer: Self-pay | Admitting: *Deleted

## 2024-08-22 ENCOUNTER — Other Ambulatory Visit: Payer: Self-pay | Admitting: Internal Medicine

## 2024-08-22 DIAGNOSIS — I639 Cerebral infarction, unspecified: Secondary | ICD-10-CM

## 2024-08-31 ENCOUNTER — Ambulatory Visit
Admission: RE | Admit: 2024-08-31 | Discharge: 2024-08-31 | Disposition: A | Source: Ambulatory Visit | Attending: Internal Medicine | Admitting: Internal Medicine

## 2024-08-31 DIAGNOSIS — I639 Cerebral infarction, unspecified: Secondary | ICD-10-CM | POA: Insufficient documentation

## 2024-09-12 ENCOUNTER — Encounter
Admission: RE | Disposition: A | Discharge status: Home / Self Care | Payer: Self-pay | Source: Home / Self Care | Attending: Gastroenterology

## 2024-09-12 ENCOUNTER — Ambulatory Visit: Admitting: Certified Registered"

## 2024-09-12 ENCOUNTER — Ambulatory Visit
Admission: RE | Admit: 2024-09-12 | Discharge: 2024-09-12 | Disposition: A | Discharge status: Home / Self Care | Attending: Gastroenterology | Admitting: Gastroenterology

## 2024-09-12 ENCOUNTER — Other Ambulatory Visit: Payer: Self-pay

## 2024-09-12 DIAGNOSIS — F419 Anxiety disorder, unspecified: Secondary | ICD-10-CM | POA: Insufficient documentation

## 2024-09-12 DIAGNOSIS — Z83719 Family history of colon polyps, unspecified: Secondary | ICD-10-CM | POA: Diagnosis present

## 2024-09-12 DIAGNOSIS — K5730 Diverticulosis of large intestine without perforation or abscess without bleeding: Secondary | ICD-10-CM | POA: Insufficient documentation

## 2024-09-12 DIAGNOSIS — F32A Depression, unspecified: Secondary | ICD-10-CM | POA: Diagnosis not present

## 2024-09-12 DIAGNOSIS — D124 Benign neoplasm of descending colon: Secondary | ICD-10-CM | POA: Insufficient documentation

## 2024-09-12 DIAGNOSIS — Z1211 Encounter for screening for malignant neoplasm of colon: Secondary | ICD-10-CM | POA: Diagnosis not present

## 2024-09-12 DIAGNOSIS — K640 First degree hemorrhoids: Secondary | ICD-10-CM | POA: Diagnosis not present

## 2024-09-12 DIAGNOSIS — I10 Essential (primary) hypertension: Secondary | ICD-10-CM | POA: Insufficient documentation

## 2024-09-12 HISTORY — PX: POLYPECTOMY: SHX149

## 2024-09-12 HISTORY — DX: Cardiac arrhythmia, unspecified: I49.9

## 2024-09-12 HISTORY — PX: COLONOSCOPY: SHX5424

## 2024-09-12 SURGERY — COLONOSCOPY
Anesthesia: General

## 2024-09-12 MED ORDER — SODIUM CHLORIDE 0.9 % IV SOLN: INTRAVENOUS | Status: DC

## 2024-09-12 MED ORDER — PROPOFOL 500 MG/50ML IV EMUL
INTRAVENOUS | Status: DC | PRN
  Administered 2024-09-12: 165 ug/kg/min via INTRAVENOUS

## 2024-09-12 MED ORDER — GLYCOPYRROLATE 0.2 MG/ML IJ SOLN
INTRAMUSCULAR | Status: DC | PRN
  Administered 2024-09-12: .2 mg via INTRAVENOUS

## 2024-09-12 MED ORDER — LIDOCAINE HCL (CARDIAC) PF 100 MG/5ML IV SOSY
PREFILLED_SYRINGE | INTRAVENOUS | Status: DC | PRN
  Administered 2024-09-12: 100 mg via INTRAVENOUS

## 2024-09-12 MED ORDER — PROPOFOL 10 MG/ML IV BOLUS
INTRAVENOUS | Status: DC | PRN
  Administered 2024-09-12: 50 mg via INTRAVENOUS

## 2024-09-12 MED ADMIN — Ephedrine Sulf-NaCl Soln Pref Syr 50 MG/10ML-0.9% (5 MG/ML): 10 mg | INTRAVENOUS | NDC 99999070054

## 2024-09-12 NOTE — Anesthesia Postprocedure Evaluation (Signed)
 Anesthesia Post Note  Patient: Charlotte Webb  Procedure(s) Performed: COLONOSCOPY POLYPECTOMY, INTESTINE  Patient location during evaluation: Endoscopy Anesthesia Type: General Level of consciousness: awake and alert Pain management: pain level controlled Vital Signs Assessment: post-procedure vital signs reviewed and stable Respiratory status: spontaneous breathing, nonlabored ventilation, respiratory function stable and patient connected to nasal cannula oxygen Cardiovascular status: blood pressure returned to baseline and stable Postop Assessment: no apparent nausea or vomiting Anesthetic complications: no   No notable events documented.   Last Vitals:  Vitals:   09/12/24 1405 09/12/24 1415  BP: 129/67 135/69  Pulse: (!) 58 (!) 57  Resp: 13 13  Temp:    SpO2: 100% 100%    Last Pain:  Vitals:   09/12/24 1415  TempSrc:   PainSc: 0-No pain                 Debby Mines

## 2024-09-12 NOTE — Anesthesia Preprocedure Evaluation (Signed)
 Anesthesia Evaluation  Patient identified by MRN, date of birth, ID band Patient awake    Reviewed: Allergy & Precautions, NPO status , Patient's Chart, lab work & pertinent test results  History of Anesthesia Complications Negative for: history of anesthetic complications  Airway Mallampati: III  TM Distance: >3 FB Neck ROM: full    Dental  (+) Chipped, Poor Dentition   Pulmonary neg shortness of breath   Pulmonary exam normal        Cardiovascular Exercise Tolerance: Good hypertension, (-) angina Normal cardiovascular exam+ dysrhythmias      Neuro/Psych  PSYCHIATRIC DISORDERS Anxiety Depression    negative neurological ROS     GI/Hepatic negative GI ROS, Neg liver ROS,neg GERD  ,,  Endo/Other  negative endocrine ROS    Renal/GU      Musculoskeletal   Abdominal   Peds  Hematology negative hematology ROS (+)   Anesthesia Other Findings Past Medical History: No date: Anxiety No date: Arrhythmia No date: Depression No date: Goiter No date: Hypertension No date: Mitral valve disorder No date: Osteoporosis No date: Palpitations  Past Surgical History: 01/28/2020: CATARACT EXTRACTION W/PHACO; Right     Comment:  Procedure: CATARACT EXTRACTION PHACO AND INTRAOCULAR               LENS PLACEMENT (IOC) RIGHT;  Surgeon: Mittie Gaskin, MD;  Location: [facility];  Service:               Ophthalmology;  Laterality: Right;  10.43 0:52.0 20.0% No date: COLONOSCOPY 01/30/2018: COLONOSCOPY WITH PROPOFOL ; N/A     Comment:  Procedure: COLONOSCOPY WITH PROPOFOL ;  Surgeon: Viktoria Lamar DASEN, MD;  Location: [facility] ENDOSCOPY;  Service:               Endoscopy;  Laterality: N/A; 10/04/2018: HIP PINNING,CANNULATED; Left     Comment:  Procedure: CANNULATED HIP PINNING;  Surgeon: Kathlynn Sharper, MD;  Location: [facility] ORS;  Service: Orthopedics;               Laterality: Left; No  date: thyroid nodule removed 20 years ago No date: TUBAL LIGATION  BMI    Body Mass Index: 24.21 kg/m      Reproductive/Obstetrics negative OB ROS                              Anesthesia Physical Anesthesia Plan  ASA: 3  Anesthesia Plan: General   Post-op Pain Management: Minimal or no pain anticipated   Induction: Intravenous  PONV Risk Score and Plan: 2 and Propofol  infusion and TIVA  Airway Management Planned: Nasal Cannula  Additional Equipment: None  Intra-op Plan:   Post-operative Plan:   Informed Consent: I have reviewed the patients History and Physical, chart, labs and discussed the procedure including the risks, benefits and alternatives for the proposed anesthesia with the patient or authorized representative who has indicated his/her understanding and acceptance.     Dental advisory given  Plan Discussed with: CRNA and Surgeon  Anesthesia Plan Comments: (Discussed risks of anesthesia with patient, including possibility of difficulty with spontaneous ventilation under anesthesia necessitating airway intervention, PONV, and rare risks such as cardiac or respiratory or neurological events, and allergic reactions. Discussed the  role of CRNA in patient's perioperative care. Patient understands.)        Anesthesia Quick Evaluation

## 2024-09-12 NOTE — Interval H&P Note (Signed)
 History and Physical Interval Note:  09/12/2024 1:16 PM  Charlotte Webb  has presented today for surgery, with the diagnosis of family hx of polyps in the colon.  The various methods of treatment have been discussed with the patient and family. After consideration of risks, benefits and other options for treatment, the patient has consented to  Procedure(s): COLONOSCOPY (N/A) as a surgical intervention.  The patient's history has been reviewed, patient examined, no change in status, stable for surgery.  I have reviewed the patient's chart and labs.  Questions were answered to the patient's satisfaction.     Ole ONEIDA Schick  Ok to proceed with colonoscopy

## 2024-09-12 NOTE — Anesthesia Procedure Notes (Addendum)
 Procedure Name: General with mask airway Date/Time: 09/12/2024 1:23 PM  Performed by: Ledora Duncan, CRNAPre-anesthesia Checklist: Patient identified, Emergency Drugs available, Suction available and Patient being monitored Patient Re-evaluated:Patient Re-evaluated prior to induction Oxygen Delivery Method: Simple face mask Induction Type: IV induction Placement Confirmation: positive ETCO2 and breath sounds checked- equal and bilateral Dental Injury: Teeth and Oropharynx as per pre-operative assessment

## 2024-09-12 NOTE — Transfer of Care (Signed)
 Immediate Anesthesia Transfer of Care Note  Patient: Charlotte Webb  Procedure(s) Performed: COLONOSCOPY POLYPECTOMY, INTESTINE  Patient Location: Endoscopy Unit  Anesthesia Type:General  Level of Consciousness: drowsy and patient cooperative  Airway & Oxygen Therapy: Patient Spontanous Breathing and Patient connected to face mask oxygen  Post-op Assessment: Report given to RN and Post -op Vital signs reviewed and stable  Post vital signs: Reviewed and stable  Last Vitals:  Vitals Value Taken Time  BP 81/54 09/12/24 13:45  Temp 35.6 C 09/12/24 13:45  Pulse 59 09/12/24 13:46  Resp 13 09/12/24 13:46  SpO2 100 % 09/12/24 13:46  Vitals shown include unfiled device data.  Last Pain:  Vitals:   09/12/24 1345  TempSrc: Temporal  PainSc: 0-No pain         Complications: No notable events documented.

## 2024-09-12 NOTE — Op Note (Signed)
 [facility] Gastroenterology Patient Name: Charlotte Webb Procedure Date: 09/12/2024 1:05 PM MRN: [id number] Account #: [id number] Date of Birth: [birth year] Admit Type: Outpatient Age: 78 Room: [facility] ENDO ROOM 3 Gender: Female Note Status: Finalized Instrument Name: Colon Scope [id number] Procedure:             Colonoscopy Indications:           Colon cancer screening in patient at increased risk:                         Family history of 1st-degree relative with colon polyps Providers:             Ole Schick MD, MD Referring MD:          Charlotte PHEBE Pinal, MD (Referring MD) Medicines:             Monitored Anesthesia Care Complications:         No immediate complications. Estimated blood loss:                         Minimal. Procedure:             Pre-Anesthesia Assessment:                        - Prior to the procedure, a History and Physical was                         performed, and patient medications and allergies were                         reviewed. The patient is competent. The risks and                         benefits of the procedure and the sedation options and                         risks were discussed with the patient. All questions                         were answered and informed consent was obtained.                         Patient identification and proposed procedure were                         verified by the physician, the nurse, the                         anesthesiologist, the anesthetist and the technician                         in the endoscopy suite. Mental Status Examination:                         alert and oriented. Airway Examination: normal                         oropharyngeal airway and neck mobility. Respiratory  Examination: clear to auscultation. CV Examination:                         normal. Prophylactic Antibiotics: The patient does not                         require prophylactic  antibiotics. Prior                         Anticoagulants: The patient has taken no anticoagulant                         or antiplatelet agents. ASA Grade Assessment: III - A                         patient with severe systemic disease. After reviewing                         the risks and benefits, the patient was deemed in                         satisfactory condition to undergo the procedure. The                         anesthesia plan was to use monitored anesthesia care                         (MAC). Immediately prior to administration of                         medications, the patient was re-assessed for adequacy                         to receive sedatives. The heart rate, respiratory                         rate, oxygen saturations, blood pressure, adequacy of                         pulmonary ventilation, and response to care were                         monitored throughout the procedure. The physical                         status of the patient was re-assessed after the                         procedure.                        After obtaining informed consent, the colonoscope was                         passed under direct vision. Throughout the procedure,                         the patient's blood pressure, pulse, and oxygen  saturations were monitored continuously. The                         Colonoscope was introduced through the anus and                         advanced to the the cecum, identified by appendiceal                         orifice and ileocecal valve. The colonoscopy was                         performed without difficulty. The patient tolerated                         the procedure well. The quality of the bowel                         preparation was good. The ileocecal valve, appendiceal                         orifice, and rectum were photographed. Findings:      The perianal and digital rectal examinations were normal.      A 2  mm polyp was found in the descending colon. The polyp was sessile.       The polyp was removed with a jumbo cold forceps. Resection and retrieval       were complete. Estimated blood loss was minimal.      A single small-mouthed diverticulum was found in the sigmoid colon.      Internal hemorrhoids were found during retroflexion. The hemorrhoids       were Grade I (internal hemorrhoids that do not prolapse).      The exam was otherwise without abnormality on direct and retroflexion       views. Impression:            - One 2 mm polyp in the descending colon, removed with                         a jumbo cold forceps. Resected and retrieved.                        - Diverticulosis in the sigmoid colon.                        - Internal hemorrhoids.                        - The examination was otherwise normal on direct and                         retroflexion views. Recommendation:        - Discharge patient to home.                        - Resume previous diet.                        - Continue present medications.                        -  Await pathology results.                        - Repeat colonoscopy is not recommended due to current                         age (76 years or older) for surveillance.                        - Return to referring physician as previously                         scheduled. Procedure Code(s):     --- Professional ---                        [phone], Colonoscopy, flexible; with biopsy, single or                         multiple Diagnosis Code(s):     --- Professional ---                        Z83.71, Family history of colonic polyps                        D12.4, Benign neoplasm of descending colon                        K64.0, First degree hemorrhoids                        K57.30, Diverticulosis of large intestine without                         perforation or abscess without bleeding CPT copyright 2022 [employer]. All rights  reserved. The codes documented in this report are preliminary and upon coder review may  be revised to meet current compliance requirements. Ole Schick MD, MD 09/12/2024 1:46:15 PM Number of Addenda: 0 Note Initiated On: 09/12/2024 1:05 PM Scope Withdrawal Time: 0 hours 6 minutes 43 seconds  Total Procedure Duration: 0 hours 15 minutes 34 seconds  Estimated Blood Loss:  Estimated blood loss was minimal.      [facility]

## 2024-09-12 NOTE — H&P (Signed)
 Outpatient short stay form Pre-procedure 09/12/2024  Charlotte ONEIDA Schick, MD  Primary Physician: Cleotilde Oneil FALCON, MD  Reason for visit:  Screening  History of present illness:    78 y/o lady with history of hypertension here for screening colonoscopy. Last colonoscopy in 2019 was unremarkable. Has family history of polyps in her mother. No blood thinners except aspirin . No abdominal surgeries. No family history of colon cancer.    Current Facility-Administered Medications:    0.9 %  sodium chloride  infusion, , Intravenous, Continuous, Ilo Beamon, Charlotte ONEIDA, MD, Last Rate: 20 mL/hr at 09/12/24 1226, Continued from Pre-op at 09/12/24 1226  Medications Prior to Admission  Medication Sig Dispense Refill Last Dose/Taking   acetaminophen  (TYLENOL ) 500 MG tablet Take 500 mg by mouth every 4 (four) hours as needed for mild pain or moderate pain.    Past Month   Ascorbic Acid (VITAMIN C PO) Take by mouth daily.   Past Week   aspirin  EC 81 MG tablet Take 81 mg by mouth every evening.    09/11/2024   escitalopram  (LEXAPRO ) 10 MG tablet Take 10 mg by mouth daily.   Past Week   propranolol (INDERAL) 40 MG tablet Take 40 mg by mouth 2 (two) times daily.   09/12/2024   zinc gluconate 50 MG tablet Take 50 mg by mouth daily.   Past Week   Calcium Carb-Cholecalciferol (CALCIUM CARBONATE-VITAMIN D3 PO) Take 500 mg by mouth.        No Known Allergies   Past Medical History:  Diagnosis Date   Anxiety    Arrhythmia    Depression    Dysrhythmia    Goiter    Hypertension    Mitral valve disorder    Osteoporosis    Palpitations     Review of systems:  Otherwise negative.    Physical Exam  Gen: Alert, oriented. Appears stated age.  HEENT: PERRLA. Lungs: No respiratory distress CV: RRR Abd: soft, benign, no masses Ext: No edema    Planned procedures: Proceed with colonoscopy. The patient understands the nature of the planned procedure, indications, risks, alternatives and potential  complications including but not limited to bleeding, infection, perforation, damage to internal organs and possible oversedation/side effects from anesthesia. The patient agrees and gives consent to proceed.  Please refer to procedure notes for findings, recommendations and patient disposition/instructions.     Charlotte ONEIDA Schick, MD [facility] Gastroenterology

## 2024-09-16 LAB — SURGICAL PATHOLOGY
